# Patient Record
Sex: Female | Born: 1975 | Race: Black or African American | Hispanic: No | Marital: Married | Smoking: Never smoker
Health system: Southern US, Community
[De-identification: ages and names within clinical notes are randomized; demographics above are authoritative.]

## PROBLEM LIST (undated history)

## (undated) DIAGNOSIS — A6920 Lyme disease, unspecified: Secondary | ICD-10-CM

## (undated) DIAGNOSIS — M797 Fibromyalgia: Secondary | ICD-10-CM

## (undated) DIAGNOSIS — J45909 Unspecified asthma, uncomplicated: Secondary | ICD-10-CM

## (undated) DIAGNOSIS — F909 Attention-deficit hyperactivity disorder, unspecified type: Secondary | ICD-10-CM

## (undated) DIAGNOSIS — M1990 Unspecified osteoarthritis, unspecified site: Secondary | ICD-10-CM

## (undated) HISTORY — DX: Unspecified asthma, uncomplicated: J45.909

## (undated) HISTORY — DX: Attention-deficit hyperactivity disorder, unspecified type: F90.9

## (undated) HISTORY — PX: ABDOMINAL HYSTERECTOMY: SHX81

## (undated) HISTORY — DX: Unspecified osteoarthritis, unspecified site: M19.90

## (undated) HISTORY — PX: CHOLECYSTECTOMY: SHX55

## (undated) HISTORY — PX: BACK SURGERY: SHX140

---

## 1999-04-28 ENCOUNTER — Emergency Department (HOSPITAL_COMMUNITY): Admission: EM | Admit: 1999-04-28 | Discharge: 1999-04-28 | Payer: Self-pay | Admitting: Emergency Medicine

## 1999-04-28 ENCOUNTER — Encounter: Payer: Self-pay | Admitting: Emergency Medicine

## 1999-05-03 ENCOUNTER — Encounter: Admission: RE | Admit: 1999-05-03 | Discharge: 1999-08-01 | Payer: Self-pay

## 2000-07-12 ENCOUNTER — Emergency Department (HOSPITAL_COMMUNITY): Admission: EM | Admit: 2000-07-12 | Discharge: 2000-07-13 | Payer: Self-pay | Admitting: Emergency Medicine

## 2000-10-01 ENCOUNTER — Other Ambulatory Visit: Admission: RE | Admit: 2000-10-01 | Discharge: 2000-10-01 | Payer: Self-pay | Admitting: Gynecology

## 2001-02-01 ENCOUNTER — Other Ambulatory Visit: Admission: RE | Admit: 2001-02-01 | Discharge: 2001-02-01 | Payer: Self-pay | Admitting: Gynecology

## 2001-07-11 ENCOUNTER — Encounter: Admission: RE | Admit: 2001-07-11 | Discharge: 2001-07-24 | Payer: Self-pay | Admitting: Family Medicine

## 2001-07-25 ENCOUNTER — Encounter: Admission: RE | Admit: 2001-07-25 | Discharge: 2001-08-23 | Payer: Self-pay | Admitting: Family Medicine

## 2001-10-23 ENCOUNTER — Other Ambulatory Visit: Admission: RE | Admit: 2001-10-23 | Discharge: 2001-10-23 | Payer: Self-pay | Admitting: Obstetrics and Gynecology

## 2001-11-13 ENCOUNTER — Encounter: Admission: RE | Admit: 2001-11-13 | Discharge: 2001-12-09 | Payer: Self-pay | Admitting: Family Medicine

## 2003-12-14 ENCOUNTER — Other Ambulatory Visit: Admission: RE | Admit: 2003-12-14 | Discharge: 2003-12-14 | Payer: Self-pay | Admitting: Obstetrics and Gynecology

## 2010-04-04 ENCOUNTER — Emergency Department (HOSPITAL_COMMUNITY): Admission: EM | Admit: 2010-04-04 | Discharge: 2010-04-04 | Payer: Self-pay | Admitting: Emergency Medicine

## 2011-09-23 ENCOUNTER — Emergency Department (HOSPITAL_COMMUNITY)
Admission: EM | Admit: 2011-09-23 | Discharge: 2011-09-23 | Disposition: A | Discharge status: Home / Self Care | Payer: Managed Care, Other (non HMO) | Attending: Emergency Medicine | Admitting: Emergency Medicine

## 2011-09-23 ENCOUNTER — Emergency Department (HOSPITAL_COMMUNITY): Payer: Managed Care, Other (non HMO)

## 2011-09-23 DIAGNOSIS — R5381 Other malaise: Secondary | ICD-10-CM | POA: Insufficient documentation

## 2011-09-23 DIAGNOSIS — I10 Essential (primary) hypertension: Secondary | ICD-10-CM | POA: Insufficient documentation

## 2011-09-23 DIAGNOSIS — G4700 Insomnia, unspecified: Secondary | ICD-10-CM | POA: Insufficient documentation

## 2011-09-23 DIAGNOSIS — R071 Chest pain on breathing: Secondary | ICD-10-CM | POA: Insufficient documentation

## 2011-09-23 DIAGNOSIS — R630 Anorexia: Secondary | ICD-10-CM | POA: Insufficient documentation

## 2011-09-23 DIAGNOSIS — IMO0001 Reserved for inherently not codable concepts without codable children: Secondary | ICD-10-CM | POA: Insufficient documentation

## 2011-09-23 DIAGNOSIS — Z79899 Other long term (current) drug therapy: Secondary | ICD-10-CM | POA: Insufficient documentation

## 2011-09-23 DIAGNOSIS — R0602 Shortness of breath: Secondary | ICD-10-CM | POA: Insufficient documentation

## 2011-09-23 DIAGNOSIS — R110 Nausea: Secondary | ICD-10-CM | POA: Insufficient documentation

## 2011-09-23 DIAGNOSIS — R42 Dizziness and giddiness: Secondary | ICD-10-CM | POA: Insufficient documentation

## 2011-09-23 LAB — BASIC METABOLIC PANEL
BUN: 9 mg/dL (ref 6–23)
CO2: 28 mEq/L (ref 19–32)
Calcium: 9.6 mg/dL (ref 8.4–10.5)
Chloride: 100 mEq/L (ref 96–112)
Creatinine, Ser: 0.78 mg/dL (ref 0.50–1.10)
GFR calc Af Amer: >60 mL/min (ref >60)
GFR calc non Af Amer: >60 mL/min (ref >60)
Glucose, Bld: 120 mg/dL — ABNORMAL HIGH (ref 70–99)
Potassium: 3.4 mEq/L — ABNORMAL LOW (ref 3.5–5.1)
Sodium: 137 mEq/L (ref 135–145)

## 2011-09-23 LAB — CBC
HCT: 35.6 % — ABNORMAL LOW (ref 36.0–46.0)
Hemoglobin: 12.1 g/dL (ref 12.0–15.0)
MCH: 26.5 pg (ref 26.0–34.0)
MCHC: 34 g/dL (ref 30.0–36.0)
MCV: 78.1 fL (ref 78.0–100.0)
Platelets: 285 10*3/uL (ref 150–400)
RBC: 4.56 MIL/uL (ref 3.87–5.11)
RDW: 13.2 % (ref 11.5–15.5)
WBC: 9.6 10*3/uL (ref 4.0–10.5)

## 2011-09-23 LAB — CK TOTAL AND CKMB (NOT AT ARMC)
CK, MB: 1.7 ng/mL (ref 0.3–4.0)
Relative Index: 1.2 (ref 0.0–2.5)
Total CK: 138 U/L (ref 7–177)

## 2011-09-23 LAB — POCT I-STAT TROPONIN I: Troponin i, poc: 0 ng/mL (ref 0.00–0.08)

## 2011-09-23 LAB — D-DIMER, QUANTITATIVE (NOT AT ARMC): D-Dimer, Quant: 0.87 ug/mL-FEU — ABNORMAL HIGH (ref 0.00–0.48)

## 2011-09-23 MED ORDER — IOHEXOL 300 MG/ML  SOLN: 100.0000 mL | Freq: Once | INTRAMUSCULAR | Status: DC | PRN

## 2012-07-08 ENCOUNTER — Encounter (HOSPITAL_COMMUNITY): Payer: Self-pay | Admitting: Emergency Medicine

## 2012-07-08 ENCOUNTER — Emergency Department (HOSPITAL_COMMUNITY)
Admission: EM | Admit: 2012-07-08 | Discharge: 2012-07-08 | Disposition: A | Discharge status: Home / Self Care | Payer: Managed Care, Other (non HMO) | Attending: Emergency Medicine | Admitting: Emergency Medicine

## 2012-07-08 DIAGNOSIS — IMO0001 Reserved for inherently not codable concepts without codable children: Secondary | ICD-10-CM | POA: Insufficient documentation

## 2012-07-08 DIAGNOSIS — M545 Low back pain, unspecified: Secondary | ICD-10-CM | POA: Insufficient documentation

## 2012-07-08 HISTORY — DX: Fibromyalgia: M79.7

## 2012-07-08 MED ORDER — TRAMADOL HCL 50 MG PO TABS
50.0000 mg | ORAL_TABLET | Freq: Once | ORAL | Status: AC
  Administered 2012-07-08: 50 mg via ORAL
  Filled 2012-07-08: qty 1

## 2012-07-08 MED ORDER — KETOROLAC TROMETHAMINE 60 MG/2ML IM SOLN
60.0000 mg | Freq: Once | INTRAMUSCULAR | Status: AC
  Administered 2012-07-08: 60 mg via INTRAMUSCULAR
  Filled 2012-07-08: qty 2

## 2012-07-08 MED ORDER — KETOROLAC TROMETHAMINE 10 MG PO TABS: 10.0000 mg | ORAL_TABLET | Freq: Four times a day (QID) | ORAL | Status: AC | PRN

## 2012-07-08 MED ORDER — TRAMADOL HCL 50 MG PO TABS: 50.0000 mg | ORAL_TABLET | Freq: Four times a day (QID) | ORAL | Status: AC | PRN

## 2012-07-08 NOTE — ED Notes (Addendum)
Pt reports back pain that radiates to right leg down the back and to her foot at 10/10. Denies fall or injury. Denied bladder or bowel problems.

## 2012-07-08 NOTE — ED Provider Notes (Signed)
Medical screening examination/treatment/procedure(s) were performed by non-physician practitioner and as supervising physician I was immediately available for consultation/collaboration.   Lyanne Co, MD 07/08/12 1013

## 2012-07-08 NOTE — ED Provider Notes (Signed)
History     CSN: [id number]  Arrival date & time 07/08/12  0907   First MD Initiated Contact with Patient 07/08/12 0932      No chief complaint on file.   (Consider location/radiation/quality/duration/timing/severity/associated sxs/prior treatment) Patient is a 36 y.o. female presenting with back pain. The history is provided by the patient and a parent.  Back Pain  The pain is at a severity of 10/10. Pertinent negatives include no fever, no numbness, no dysuria and no weakness.   36 y/o female INAD c/o bilateral low back pain x1 day. Denies fever/chills, numbness, change in bowel or bladder habits, or trauma.  Pain is positional and not relived by muscle relaxers or OTC Rx.   Past Medical History  Diagnosis Date  . Fibromyalgia     History reviewed. No pertinent past surgical history.  No family history on file.  History  Substance Use Topics  . Smoking status: Never Smoker   . Smokeless tobacco: Not on file  . Alcohol Use: No    OB History    Grav Para Term Preterm Abortions TAB SAB Ect Mult Living                  Review of Systems  Constitutional: Negative for fever, fatigue and unexpected weight change.  Gastrointestinal: Negative for nausea, vomiting, diarrhea, constipation and rectal pain.  Genitourinary: Negative for dysuria, hematuria, flank pain and menstrual problem.  Musculoskeletal: Positive for back pain.  Skin: Negative for rash.  Neurological: Negative for weakness and numbness.  All other systems reviewed and are negative.    Allergies  Review of patient's allergies indicates no known allergies.  Home Medications   Current Outpatient Rx  Name Route Sig Dispense Refill  . CYCLOBENZAPRINE HCL 10 MG PO TABS Oral Take 10 mg by mouth 3 (three) times daily as needed. pain    . NAPROXEN 500 MG PO TABS Oral Take 1,000 mg by mouth 2 (two) times daily as needed. pain      BP 150/88  Pulse 86  Temp 98 F (36.7 C) (Oral)  Resp 18  SpO2  100%  Physical Exam  Nursing note and vitals reviewed. Constitutional: She is oriented to person, place, and time. She appears well-developed and well-nourished. No distress.  HENT:  Head: Normocephalic.  Eyes: Conjunctivae and EOM are normal.  Cardiovascular: Normal rate.   Pulmonary/Chest: Effort normal.  Musculoskeletal: Normal range of motion.       Bilateral tenderness to paraspinal muscles in the lumbar region  Neurological: She is alert and oriented to person, place, and time.       CN 3-12 intact with strength and sensation normal x4 extremities  Psychiatric: She has a normal mood and affect.    ED Course  Procedures (including critical care time)  Labs Reviewed - No data to display No results found.   1. Lumbago       MDM  Healthy female with acute exacerbation of of chronic back pain. No red flags. Will d/c with toradol and tramadol.  Pt verbalized understanding and agrees with care plan. Outpatient follow-up and return precautions given.           Wynetta Emery, PA-C 07/08/12 1008

## 2020-05-10 ENCOUNTER — Emergency Department (HOSPITAL_BASED_OUTPATIENT_CLINIC_OR_DEPARTMENT_OTHER): Payer: Federal, State, Local not specified - PPO

## 2020-05-10 ENCOUNTER — Other Ambulatory Visit: Payer: Self-pay

## 2020-05-10 ENCOUNTER — Encounter (HOSPITAL_BASED_OUTPATIENT_CLINIC_OR_DEPARTMENT_OTHER): Payer: Self-pay | Admitting: *Deleted

## 2020-05-10 ENCOUNTER — Emergency Department (HOSPITAL_BASED_OUTPATIENT_CLINIC_OR_DEPARTMENT_OTHER)
Admission: EM | Admit: 2020-05-10 | Discharge: 2020-05-10 | Disposition: A | Discharge status: Home / Self Care | Payer: Federal, State, Local not specified - PPO | Attending: Emergency Medicine | Admitting: Emergency Medicine

## 2020-05-10 DIAGNOSIS — Z79899 Other long term (current) drug therapy: Secondary | ICD-10-CM | POA: Insufficient documentation

## 2020-05-10 DIAGNOSIS — R0602 Shortness of breath: Secondary | ICD-10-CM | POA: Diagnosis present

## 2020-05-10 DIAGNOSIS — Z20822 Contact with and (suspected) exposure to covid-19: Secondary | ICD-10-CM | POA: Insufficient documentation

## 2020-05-10 DIAGNOSIS — R05 Cough: Secondary | ICD-10-CM | POA: Diagnosis not present

## 2020-05-10 DIAGNOSIS — J4541 Moderate persistent asthma with (acute) exacerbation: Secondary | ICD-10-CM | POA: Insufficient documentation

## 2020-05-10 DIAGNOSIS — J069 Acute upper respiratory infection, unspecified: Secondary | ICD-10-CM | POA: Diagnosis not present

## 2020-05-10 HISTORY — DX: Lyme disease, unspecified: A69.20

## 2020-05-10 LAB — CBC WITH DIFFERENTIAL/PLATELET
Abs Immature Granulocytes: 0.03 10*3/uL (ref 0.00–0.07)
Basophils Absolute: 0 10*3/uL (ref 0.0–0.1)
Basophils Relative: 0 %
Eosinophils Absolute: 0.1 10*3/uL (ref 0.0–0.5)
Eosinophils Relative: 1 %
HCT: 38.4 % (ref 36.0–46.0)
Hemoglobin: 12.4 g/dL (ref 12.0–15.0)
Immature Granulocytes: 0 %
Lymphocytes Relative: 26 %
Lymphs Abs: 2.6 10*3/uL (ref 0.7–4.0)
MCH: 26.1 pg (ref 26.0–34.0)
MCHC: 32.3 g/dL (ref 30.0–36.0)
MCV: 80.8 fL (ref 80.0–100.0)
Monocytes Absolute: 1 10*3/uL (ref 0.1–1.0)
Monocytes Relative: 10 %
Neutro Abs: 6 10*3/uL (ref 1.7–7.7)
Neutrophils Relative %: 63 %
Platelets: 293 10*3/uL (ref 150–400)
RBC: 4.75 MIL/uL (ref 3.87–5.11)
RDW: 13.4 % (ref 11.5–15.5)
WBC: 9.8 10*3/uL (ref 4.0–10.5)
nRBC: 0 % (ref 0.0–0.2)

## 2020-05-10 LAB — BASIC METABOLIC PANEL
Anion gap: 11 (ref 5–15)
BUN: 16 mg/dL (ref 6–20)
CO2: 25 mmol/L (ref 22–32)
Calcium: 9.3 mg/dL (ref 8.9–10.3)
Chloride: 100 mmol/L (ref 98–111)
Creatinine, Ser: 0.94 mg/dL (ref 0.44–1.00)
GFR calc Af Amer: >60 mL/min (ref >60)
GFR calc non Af Amer: >60 mL/min (ref >60)
Glucose, Bld: 84 mg/dL (ref 70–99)
Potassium: 3.4 mmol/L — ABNORMAL LOW (ref 3.5–5.1)
Sodium: 136 mmol/L (ref 135–145)

## 2020-05-10 LAB — PREGNANCY, URINE: Preg Test, Ur: NEGATIVE

## 2020-05-10 LAB — SARS CORONAVIRUS 2 BY RT PCR (HOSPITAL ORDER, PERFORMED IN ~~LOC~~ HOSPITAL LAB): SARS Coronavirus 2: NEGATIVE

## 2020-05-10 MED ORDER — IPRATROPIUM-ALBUTEROL 0.5-2.5 (3) MG/3ML IN SOLN
3.0000 mL | Freq: Once | RESPIRATORY_TRACT | Status: AC
  Administered 2020-05-10: 3 mL via RESPIRATORY_TRACT
  Filled 2020-05-10: qty 3

## 2020-05-10 MED ORDER — PREDNISONE 50 MG PO TABS
60.0000 mg | ORAL_TABLET | Freq: Once | ORAL | Status: AC
  Administered 2020-05-10: 60 mg via ORAL
  Filled 2020-05-10: qty 1

## 2020-05-10 MED ORDER — IOHEXOL 350 MG/ML SOLN
100.0000 mL | Freq: Once | INTRAVENOUS | Status: AC
  Administered 2020-05-10: 100 mL via INTRAVENOUS

## 2020-05-10 MED ORDER — FLOVENT HFA 44 MCG/ACT IN AERO: 2.0000 | INHALATION_SPRAY | Freq: Two times a day (BID) | RESPIRATORY_TRACT | 12 refills | Status: AC

## 2020-05-10 MED ORDER — PREDNISONE 10 MG PO TABS: 50.0000 mg | ORAL_TABLET | Freq: Every day | ORAL | 0 refills | Status: AC

## 2020-05-10 MED ORDER — BENZONATATE 200 MG PO CAPS: 200.0000 mg | ORAL_CAPSULE | Freq: Three times a day (TID) | ORAL | 0 refills | Status: AC

## 2020-05-10 NOTE — ED Provider Notes (Signed)
[facility]PART[facility] Provider Note   CSN: [id number] Arrival date & time: 05/10/20  1912     History Chief Complaint  Patient presents with  . Shortness of Breath    Melissa Donovan is a 44 y.o. female.  44 year old female with past medical history of chronic Lyme disease and antiphospholipid antibody syndrome, asthma, fibromyalgia, chest wall pain presents with complaint of shortness of breath, worse with exertion as well as pressure in her chest.  Patient states that her symptoms started as congestion and runny nose 1 week ago, progressing to cough with wheezing and shortness of breath.  Patient went to urgent care 2 days ago and had a negative Covid test.  Patient self treated at home with her albuterol inhaler as well as clindamycin and minocycline which she has on hand for her Lyme disease.  Patient has not taken prednisone, states that she has a history of bronchitis, concerned this is what she has at this point.  Denies fevers or chills, no known sick contacts.  No other complaints or concerns.  Denies history of PE or DVT however due to her antiphospholipid antibody syndrome, was seen by hematology who recommended Eliquis however patient could not take this with her birth control and opted not to take the Eliquis.        Past Medical History:  Diagnosis Date  . Fibromyalgia   . Lyme disease     There are no problems to display for this patient.   History reviewed. No pertinent surgical history.   OB History   No obstetric history on file.     History reviewed. No pertinent family history.  Social History   Tobacco Use  . Smoking status: Never Smoker  . Smokeless tobacco: Never Used  Substance Use Topics  . Alcohol use: No  . Drug use: Never    Home Medications Prior to Admission medications   Medication Sig Start Date End Date Taking? Authorizing Provider  benzonatate (TESSALON) 200 MG capsule Take 1 capsule (200 mg total) by  mouth every 8 (eight) hours for 10 days. 05/10/20 05/20/20  Tacy Learn, PA-C  cyclobenzaprine (FLEXERIL) 10 MG tablet Take 10 mg by mouth 3 (three) times daily as needed. pain    [provider]  fluticasone (FLOVENT HFA) 44 MCG/ACT inhaler Inhale 2 puffs into the lungs 2 (two) times daily. 05/10/20   Tacy Learn, PA-C  naproxen (NAPROSYN) 500 MG tablet Take 1,000 mg by mouth 2 (two) times daily as needed. pain    [provider]  predniSONE (DELTASONE) 10 MG tablet Take 5 tablets (50 mg total) by mouth daily for 4 days. 05/10/20 05/14/20  Tacy Learn, PA-C    Allergies    Patient has no known allergies.  Review of Systems   Review of Systems  Constitutional: Negative for chills, diaphoresis and fever.  HENT: Positive for congestion and rhinorrhea.   Respiratory: Positive for cough, chest tightness, shortness of breath and wheezing.   Cardiovascular: Positive for chest pain.  Gastrointestinal: Negative for vomiting.  Musculoskeletal: Negative for arthralgias and myalgias.  Skin: Negative for rash and wound.  Neurological: Negative for weakness.  Hematological: Negative for adenopathy.  All other systems reviewed and are negative.   Physical Exam Updated Vital Signs BP (!) 144/92 (BP Location: Right Arm)   Pulse (!) 115   Temp 98.5 F (36.9 C) (Oral)   Resp (!) 26   Ht 5\' 8"  (1.727 m)   Wt 97.1 kg  LMP 04/20/2020   SpO2 100%   BMI 32.54 kg/m   Physical Exam Vitals and nursing note reviewed.  Constitutional:      General: She is not in acute distress.    Appearance: She is well-developed. She is not diaphoretic.  HENT:     Head: Normocephalic and atraumatic.  Cardiovascular:     Rate and Rhythm: Regular rhythm. Tachycardia present.  Pulmonary:     Effort: Pulmonary effort is normal.     Breath sounds: No decreased breath sounds or wheezing.  Chest:     Chest wall: Tenderness present.  Musculoskeletal:     Cervical back: Neck supple.      Right lower leg: No edema.     Left lower leg: No edema.  Skin:    General: Skin is warm and dry.  Neurological:     Mental Status: She is alert and oriented to person, place, and time.  Psychiatric:        Behavior: Behavior normal.     ED Results / Procedures / Treatments   Labs (all labs ordered are listed, but only abnormal results are displayed) Labs Reviewed  BASIC METABOLIC PANEL - Abnormal; Notable for the following components:      Result Value   Potassium 3.4 (*)    All other components within normal limits  SARS CORONAVIRUS 2 BY RT PCR (HOSPITAL ORDER, [facility])  CBC WITH DIFFERENTIAL/PLATELET  PREGNANCY, URINE    EKG EKG Interpretation  Date/Time:  Monday May 10 2020 20:02:40 EDT Ventricular Rate:  108 PR Interval:    QRS Duration: 77 QT Interval:  333 QTC Calculation: 447 R Axis:   44 Text Interpretation: Sinus tachycardia No significant change since last tracing Confirmed by Isla Pence [phone]) on 05/10/2020 8:09:24 PM   Radiology CT Angio Chest PE W/Cm &/Or Wo Cm  Result Date: 05/10/2020 CLINICAL DATA:  Shortness of breath, cough EXAM: CT ANGIOGRAPHY CHEST WITH CONTRAST TECHNIQUE: Multidetector CT imaging of the chest was performed using the standard protocol during bolus administration of intravenous contrast. Multiplanar CT image reconstructions and MIPs were obtained to evaluate the vascular anatomy. CONTRAST:  133mL OMNIPAQUE IOHEXOL 350 MG/ML SOLN COMPARISON:  09/23/2011 FINDINGS: Cardiovascular: No filling defects in the pulmonary arteries to suggest pulmonary emboli. Heart is normal size. Aorta normal caliber. No dissection. Mediastinum/Nodes: No mediastinal, hilar, or axillary adenopathy. Trachea and esophagus are unremarkable. Thyroid unremarkable. Lungs/Pleura: Lungs are clear. No focal airspace opacities or suspicious nodules. No effusions. Upper Abdomen: Imaging into the upper abdomen shows no acute findings.  Musculoskeletal: Chest wall soft tissues are unremarkable. No acute bony abnormality. Review of the MIP images confirms the above findings. IMPRESSION: No evidence of pulmonary embolus. No acute cardiopulmonary disease. Electronically Signed   By: Rolm Baptise M.D.   On: 05/10/2020 21:22   DG Chest Port 1 View  Result Date: 05/10/2020 CLINICAL DATA:  Cough and shortness of breath EXAM: PORTABLE CHEST 1 VIEW COMPARISON:  09/23/2011 FINDINGS: The heart size and mediastinal contours are within normal limits. Both lungs are clear. The visualized skeletal structures are unremarkable. IMPRESSION: No active disease. Electronically Signed   By: Constance Holster M.D.   On: 05/10/2020 20:31    Procedures Procedures (including critical care time)  Medications Ordered in ED Medications  iohexol (OMNIPAQUE) 350 MG/ML injection 100 mL (100 mLs Intravenous Contrast Given 05/10/20 2104)  predniSONE (DELTASONE) tablet 60 mg (60 mg Oral Given 05/10/20 2146)  ipratropium-albuterol (DUONEB) 0.5-2.5 (3) MG/3ML nebulizer  solution 3 mL (3 mLs Nebulization Given 05/10/20 2148)    ED Course  I have reviewed the triage vital signs and the nursing notes.  Pertinent labs & imaging results that were available during my care of the patient were reviewed by me and considered in my medical decision making (see chart for details).  Clinical Course as of May 10 2245  Mon May 10, 2020  2241 44yo female with history listed above, arrives to the ER tachycardic with HR of 125, EKG shows sinus tach. Due to medical history with [facility] and tachycardia, proceeded with CTA chest to evaluate for PE, CT is normal. Labs without significant findings including negative COVID, CBC, BMP WNL, upreg negative.  Patient was given prednisone as well as duoneb treatment with some improvement.  Patient takes singulair daily for her asthma, has an albuterol inhaler, also uses Nasocort. Patient has an inhaler used for COPD given to her by her PCP to  treat an acute bronchitis episode last year, advised not to use this. Will add Flovent to her daily regimen with prednisone burst. Also given Tessalon for cough. Recommend recheck with PCP, return to ER as needed.   [LM]    Clinical Course User Index [LM] Roque Lias   MDM Rules/Calculators/A&P                      Final Clinical Impression(s) / ED Diagnoses Final diagnoses:  Viral URI with cough  Moderate persistent asthma with exacerbation    Rx / DC Orders ED Discharge Orders         Ordered    benzonatate (TESSALON) 200 MG capsule  Every 8 hours     05/10/20 2234    predniSONE (DELTASONE) 10 MG tablet  Daily     05/10/20 2234    fluticasone (FLOVENT HFA) 44 MCG/ACT inhaler  2 times daily     05/10/20 2234           Tacy Learn, PA-C 05/10/20 2246    Isla Pence, MD 05/10/20 2319

## 2020-05-10 NOTE — ED Notes (Signed)
Pt. Had a COVID Test on Sat at [facility] on  Sat and was Neg.  Pt. Reports she does get Bronchitis and feels this may be same.  Pt. Has Chronic Lyme disease and suffers with this.  Pt. Takes constant abx therapy for this.  Pt. Reports she feels she needs poss. Chest x ray and maybe prednisone due to her chest possb. Bronchitis and shortness of breath for 4 days and feeling sick since last Tues.  Pt. Reports to RN  This is why she went for COVID test on Sat.    Pt. Has had no fever.Marland Kitchen

## 2020-05-10 NOTE — Discharge Instructions (Addendum)
Take Flovent twice daily as prescribed.  Use your rescue inhaler as needed every 4-6 hours. Take prednisone daily as prescribed. Take Tessalon as needed as prescribed for cough. Recheck with your primary care provider.

## 2020-05-10 NOTE — ED Triage Notes (Signed)
SOB. Cough. Hx of asthma. She had a negative Covid test 2 days ago.  Lyme disease.

## 2021-01-14 NOTE — Progress Notes (Signed)
[facility] Note  01/19/2021     CHIEF COMPLAINT Patient presents for Retina Evaluation   HISTORY OF PRESENT ILLNESS: Melissa Donovan is a 45 y.o. female who presents to the clinic today for:   HPI    Retina Evaluation    In both eyes.  This started 2 weeks ago.  Associated Symptoms Pain.  I, the attending physician,  performed the HPI with the patient and updated documentation appropriately.          Comments    Retina eval per Dr. Posey Pronto for CME OU.  She was Dx Lyme Disease 2019 (she sees a Dr. Duncan Dull in [place] for this).  She has been having HA's and eye pain about 2 weeks OU.  She talked to Dr. Duncan Dull about her eye issues.  She started Acetazolamide 500mg  qd x7 days ago to see if it will help.  She has had inflammation though out her body.  She had this inflammation in her hand (seen ortho Dr. Rolena Infante).  Dr. Duncan Dull put her on Doxycycline to start today after her appt today.   Patient has all of her medical records with her today.  I copied last appt with Dr. Duncan Dull and her medical hx sheet.       Last edited by Bernarda Caffey, MD on 01/19/2021  9:42 AM. (History)    pt is here on the referral of Dr. Posey Pronto for concern of CME OU, pt moved here from [place], but has a complex medical history and still sees her drs in [place], pt has Lyme disease which was dx in 2019, pt has been seeing Dr. Duncan Dull in [place] since then, pt was on IV antibiotics for 8 months after dx, pt has no hx of any eye problems, she has never worn glasses, pt has been taking acetazolamide for the past 6 days and yesterday she was put back on doxycyline yesterday, but has not picked up the rx yet, pt states her exam with Dr. Posey Pronto yesterday was just a routine eye exam, pt states she has had on and off blurriness since being dx with Lyme disease, she states right now she feels like her right eye is blurry, pt endorses being light sensitive  Referring physician: Virgina Evener,  [address] [place],  [place] [address]  HISTORICAL INFORMATION:   Selected notes from the MEDICAL RECORD NUMBER Referred by Dr. Virgina Evener for concern of CME OU   CURRENT MEDICATIONS: No current outpatient medications on file. (Ophthalmic Drugs)   No current facility-administered medications for this visit. (Ophthalmic Drugs)   Current Outpatient Medications (Other)  Medication Sig  . cyclobenzaprine (FLEXERIL) 10 MG tablet Take 10 mg by mouth 3 (three) times daily as needed. pain  . fluticasone (FLOVENT HFA) 44 MCG/ACT inhaler Inhale 2 puffs into the lungs 2 (two) times daily.  . naproxen (NAPROSYN) 500 MG tablet Take 1,000 mg by mouth 2 (two) times daily as needed. pain   No current facility-administered medications for this visit. (Other)   REVIEW OF SYSTEMS: ROS    Positive for: Eyes   Negative for: Constitutional, Gastrointestinal, Neurological, Skin, Genitourinary, Musculoskeletal, HENT, Endocrine, Cardiovascular, Respiratory, Psychiatric, Allergic/Imm, Heme/Lymph   Last edited by Leonie Douglas, COA on 01/19/2021  9:33 AM. (History)     ALLERGIES No Known Allergies  PAST MEDICAL HISTORY Past Medical History:  Diagnosis Date  . Fibromyalgia   . Lyme disease    History reviewed. No pertinent surgical history.  FAMILY HISTORY History reviewed. No pertinent family history.  SOCIAL HISTORY Social History   Tobacco Use  . Smoking status: Never Smoker  . Smokeless tobacco: Never Used  Substance Use Topics  . Alcohol use: No  . Drug use: Never         OPHTHALMIC EXAM:  Base Eye Exam    Visual Acuity (Snellen - Linear)      Right Left   Dist [place] 20/70 -2 20/20   Dist ph  20/50 -2        Tonometry (Tonopen, 9:41 AM)      Right Left   Pressure 16 18       Pupils      Dark Light Shape React APD   Right 5 4 Round Brisk None   Left 5 4 Round Brisk None       Visual Fields (Counting fingers)      Left Right    Full Full        Extraocular Movement      Right Left    Full Full       Neuro/Psych    Oriented x3: Yes   Mood/Affect: Normal       Dilation    Both eyes: 1.0% Mydriacyl, 2.5% Phenylephrine @ 9:41 AM        Slit Lamp and Fundus Exam    Slit Lamp Exam      Right Left   Lids/Lashes Normal Normal   Conjunctiva/Sclera mild melanosis mild melanosis   Cornea Trace tear film debris Trace tear film debris, 1+ Punctate epithelial erosions   Anterior Chamber deep, clear, narrow temporal angle, no AC cell or flare deep, clear, narrow temporal angle, no AC cell or flare   Iris Round and dilated Round and dilated, +PPM   Lens 1+ Nuclear sclerosis 1+ Nuclear sclerosis   Vitreous Vitreous syneresis, mild cell, no snowballs Vitreous syneresis, ?trace cell, no snowballs       Fundus Exam      Right Left   Disc Pink and Sharp, +cupping, thin inferior rim Pink and Sharp   C/D Ratio 0.75 0.7   Macula Flat, Blunted foveal reflex, mild RPE mottling, trace perifoveal cystic changes, no heme Flat, Blunted foveal reflex, RPE mottling, trace cystic changes IN to fovea, no heme   Vessels attenuated, mild Copper wiring attenuated, mild Copper wiring   Periphery Attached, White without pressure, scattered punctate focal pigment changes, no snowbanking Attached, no snowbanking           Refraction    Manifest Refraction      Sphere Dist VA   Right [place] NI   Left            IMAGING AND PROCEDURES  Imaging and Procedures for 01/19/2021  OCT, Retina - OU - Both Eyes       Right Eye Quality was good. Central Foveal Thickness: 243. Progression has no prior data. Findings include normal foveal contour, intraretinal fluid, no SRF, vitreomacular adhesion  (Perifoveal cystic changes v schisis).   Left Eye Quality was good. Central Foveal Thickness: 249. Progression has no prior data. Findings include normal foveal contour, intraretinal fluid, no SRF (Focal IRF v schisis IN macula).   Notes *Images captured and  stored on drive  Diagnosis / Impression:  OD: NFP; Perifoveal cystic changes v schisis OS: NFP; Focal IRF v schisis IN macula  Clinical management:  See below  Abbreviations: NFP - Normal foveal profile. CME - cystoid macular edema. PED -  pigment epithelial detachment. IRF - intraretinal fluid. SRF - subretinal fluid. EZ - ellipsoid zone. ERM - epiretinal membrane. ORA - outer retinal atrophy. ORT - outer retinal tubulation. SRHM - subretinal hyper-reflective material. IRHM - intraretinal hyper-reflective material .       Fluorescein Angiography Optos (Transit OD)       Right Eye   Progression has no prior data. Early phase findings include window defect. Mid/Late phase findings include window defect (No leakage or vasculitis; no CME).   Left Eye   Progression has no prior data. Early phase findings include normal observations. Mid/Late phase findings include normal observations (No leakage or vasculitis; no CME).   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 Retinal edema  H35.81 OCT, Retina - OU - Both Eyes  3. Lyme disease  A69.20   4. Essential hypertension  I10   5. Hypertensive retinopathy of both eyes  H35.033 Fluorescein Angiography Optos (Transit OD)  6. Nuclear sclerosis of both eyes  H25.13   7. Glaucoma suspect of both eyes  H40.003    1,2. 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 Serita Grit office -- ?acetazolamide improving cystic edema  - discussed findings  - recommend monitoring for now  - f/u 2-3  wks  3. History of chronic Lyme Disease  - under the expert management of Dr. Duncan Dull  - no inflammation noted on dilated exam -- no anterior or posterior uveitis  - monitor  4,5. Hypertensive retinopathy OU - discussed importance of tight BP control - monitor  6. Nuclear Sclerosis OU - The symptoms of cataract, surgical options, and treatments and risks were discussed with patient - discussed diagnosis and progression - not yet visually significant - monitor for now  7. Glaucoma Suspect OU  - +cupping (OD > OS)  - +fam hx  - IOP 16,18  Ophthalmic Meds Ordered this visit:  No orders of the defined types were placed in this encounter.     Return for f/u 2-3 weeks, CME OU, DFE, OCT.  There are no Patient Instructions on file for this visit.  Explained the diagnoses, plan, and follow up with the patient and they expressed understanding.  Patient expressed understanding of the importance of proper follow up care.   This document serves as a record of services personally performed by Gardiner Sleeper, MD, PhD. It was created on their behalf by Roselee Nova, COMT. The creation of this record is the provider's dictation and/or activities during the visit.  Electronically signed by: Roselee Nova, COMT 01/19/21 1:36 PM  This document serves as a record of services personally performed by Gardiner Sleeper, MD, PhD. It was created on their behalf by Estill Bakes, COT an ophthalmic technician. The creation of this record is the provider's dictation and/or activities during the visit.    Electronically signed by: Estill Bakes, COT 1.26.22 @ 1:36 PM  Gardiner Sleeper, M.D., Ph.D. Diseases & Surgery of the Retina and  Vitreous [facility] 1.26.22  I have reviewed the above documentation for accuracy and completeness, and I agree with the above. Gardiner Sleeper, M.D., Ph.D. 01/19/21 1:36 PM  Abbreviations: M myopia (nearsighted); A astigmatism; H hyperopia  (farsighted); P presbyopia; Mrx spectacle prescription;  CTL contact lenses; OD right eye; OS left eye; OU both eyes  XT exotropia; ET esotropia; PEK punctate epithelial keratitis; PEE punctate epithelial erosions; DES dry eye syndrome; MGD meibomian gland dysfunction; ATs artificial tears; PFAT's preservative free artificial tears; [place] nuclear sclerotic cataract; PSC posterior subcapsular cataract; ERM epi-retinal membrane; PVD posterior vitreous detachment; RD retinal detachment; DM diabetes mellitus; DR diabetic retinopathy; NPDR non-proliferative diabetic retinopathy; PDR proliferative diabetic retinopathy; CSME clinically significant macular edema; DME diabetic macular edema; dbh dot blot hemorrhages; CWS cotton wool spot; POAG primary open angle glaucoma; C/D cup-to-disc ratio; HVF humphrey visual field; GVF goldmann visual field; OCT optical coherence tomography; IOP intraocular pressure; BRVO Branch retinal vein occlusion; CRVO central retinal vein occlusion; CRAO central retinal artery occlusion; BRAO branch retinal artery occlusion; RT retinal tear; SB scleral buckle; PPV pars plana vitrectomy; VH Vitreous hemorrhage; PRP panretinal laser photocoagulation; IVK intravitreal kenalog; VMT vitreomacular traction; MH Macular hole;  NVD neovascularization of the disc; NVE neovascularization elsewhere; AREDS age related eye disease study; ARMD age related macular degeneration; POAG primary open angle glaucoma; EBMD epithelial/anterior basement membrane dystrophy; ACIOL anterior chamber intraocular lens; IOL intraocular lens; PCIOL posterior chamber intraocular lens; Phaco/IOL phacoemulsification with intraocular lens placement; [place] photorefractive keratectomy; LASIK laser assisted in situ keratomileusis; HTN hypertension; DM diabetes mellitus; COPD chronic obstructive pulmonary disease

## 2021-01-19 ENCOUNTER — Other Ambulatory Visit: Payer: Self-pay

## 2021-01-19 ENCOUNTER — Ambulatory Visit (INDEPENDENT_AMBULATORY_CARE_PROVIDER_SITE_OTHER): Payer: Federal, State, Local not specified - PPO | Admitting: Ophthalmology

## 2021-01-19 ENCOUNTER — Encounter (INDEPENDENT_AMBULATORY_CARE_PROVIDER_SITE_OTHER): Payer: Self-pay | Admitting: Ophthalmology

## 2021-01-19 DIAGNOSIS — H3581 Retinal edema: Secondary | ICD-10-CM

## 2021-01-19 DIAGNOSIS — H35033 Hypertensive retinopathy, bilateral: Secondary | ICD-10-CM

## 2021-01-19 DIAGNOSIS — H33193 Other retinoschisis and retinal cysts, bilateral: Secondary | ICD-10-CM | POA: Diagnosis not present

## 2021-01-19 DIAGNOSIS — A6920 Lyme disease, unspecified: Secondary | ICD-10-CM

## 2021-01-19 DIAGNOSIS — H40003 Preglaucoma, unspecified, bilateral: Secondary | ICD-10-CM

## 2021-01-19 DIAGNOSIS — I10 Essential (primary) hypertension: Secondary | ICD-10-CM

## 2021-01-19 DIAGNOSIS — H2513 Age-related nuclear cataract, bilateral: Secondary | ICD-10-CM

## 2021-01-20 ENCOUNTER — Encounter (INDEPENDENT_AMBULATORY_CARE_PROVIDER_SITE_OTHER): Payer: Self-pay | Admitting: Ophthalmology

## 2021-01-27 ENCOUNTER — Encounter (HOSPITAL_BASED_OUTPATIENT_CLINIC_OR_DEPARTMENT_OTHER): Payer: Self-pay

## 2021-01-27 ENCOUNTER — Emergency Department (HOSPITAL_BASED_OUTPATIENT_CLINIC_OR_DEPARTMENT_OTHER)
Admission: EM | Admit: 2021-01-27 | Discharge: 2021-01-28 | Disposition: A | Discharge status: Home / Self Care | Payer: Medicare Other | Attending: Emergency Medicine | Admitting: Emergency Medicine

## 2021-01-27 ENCOUNTER — Other Ambulatory Visit: Payer: Self-pay

## 2021-01-27 ENCOUNTER — Emergency Department (HOSPITAL_BASED_OUTPATIENT_CLINIC_OR_DEPARTMENT_OTHER): Payer: Medicare Other

## 2021-01-27 DIAGNOSIS — J45909 Unspecified asthma, uncomplicated: Secondary | ICD-10-CM | POA: Diagnosis not present

## 2021-01-27 DIAGNOSIS — R079 Chest pain, unspecified: Secondary | ICD-10-CM | POA: Insufficient documentation

## 2021-01-27 DIAGNOSIS — R059 Cough, unspecified: Secondary | ICD-10-CM | POA: Diagnosis present

## 2021-01-27 DIAGNOSIS — R0602 Shortness of breath: Secondary | ICD-10-CM | POA: Diagnosis not present

## 2021-01-27 DIAGNOSIS — Z7951 Long term (current) use of inhaled steroids: Secondary | ICD-10-CM | POA: Insufficient documentation

## 2021-01-27 DIAGNOSIS — Z20822 Contact with and (suspected) exposure to covid-19: Secondary | ICD-10-CM

## 2021-01-27 MED ORDER — ALBUTEROL SULFATE HFA 108 (90 BASE) MCG/ACT IN AERS
2.0000 | INHALATION_SPRAY | RESPIRATORY_TRACT | Status: DC | PRN
  Administered 2021-01-27: 2 via RESPIRATORY_TRACT
  Filled 2021-01-27: qty 6.7

## 2021-01-27 NOTE — ED Triage Notes (Signed)
Pt presents with complaints of worsening SOB, midline CP and non prod cough x 3 days. States she has a hx of asthma and has used albuterol with minimal relief.

## 2021-01-28 ENCOUNTER — Encounter (HOSPITAL_BASED_OUTPATIENT_CLINIC_OR_DEPARTMENT_OTHER): Payer: Self-pay | Admitting: Emergency Medicine

## 2021-01-28 ENCOUNTER — Emergency Department (HOSPITAL_BASED_OUTPATIENT_CLINIC_OR_DEPARTMENT_OTHER): Payer: Medicare Other

## 2021-01-28 DIAGNOSIS — R059 Cough, unspecified: Secondary | ICD-10-CM | POA: Diagnosis not present

## 2021-01-28 LAB — CBC WITH DIFFERENTIAL/PLATELET
Abs Immature Granulocytes: 0.05 10*3/uL (ref 0.00–0.07)
Basophils Absolute: 0 10*3/uL (ref 0.0–0.1)
Basophils Relative: 0 %
Eosinophils Absolute: 0.1 10*3/uL (ref 0.0–0.5)
Eosinophils Relative: 1 %
HCT: 35.3 % — ABNORMAL LOW (ref 36.0–46.0)
Hemoglobin: 11.8 g/dL — ABNORMAL LOW (ref 12.0–15.0)
Immature Granulocytes: 1 %
Lymphocytes Relative: 27 %
Lymphs Abs: 2.8 10*3/uL (ref 0.7–4.0)
MCH: 27.7 pg (ref 26.0–34.0)
MCHC: 33.4 g/dL (ref 30.0–36.0)
MCV: 82.9 fL (ref 80.0–100.0)
Monocytes Absolute: 0.7 10*3/uL (ref 0.1–1.0)
Monocytes Relative: 7 %
Neutro Abs: 6.5 10*3/uL (ref 1.7–7.7)
Neutrophils Relative %: 64 %
Platelets: 276 10*3/uL (ref 150–400)
RBC: 4.26 MIL/uL (ref 3.87–5.11)
RDW: 13.2 % (ref 11.5–15.5)
WBC: 10.2 10*3/uL (ref 4.0–10.5)
nRBC: 0 % (ref 0.0–0.2)

## 2021-01-28 LAB — BASIC METABOLIC PANEL
Anion gap: 10 (ref 5–15)
BUN: 14 mg/dL (ref 6–20)
CO2: 18 mmol/L — ABNORMAL LOW (ref 22–32)
Calcium: 9 mg/dL (ref 8.9–10.3)
Chloride: 106 mmol/L (ref 98–111)
Creatinine, Ser: 0.79 mg/dL (ref 0.44–1.00)
GFR, Estimated: >60 mL/min (ref >60)
Glucose, Bld: 96 mg/dL (ref 70–99)
Potassium: 3.1 mmol/L — ABNORMAL LOW (ref 3.5–5.1)
Sodium: 134 mmol/L — ABNORMAL LOW (ref 135–145)

## 2021-01-28 LAB — SARS CORONAVIRUS 2 (TAT 6-24 HRS): SARS Coronavirus 2: NEGATIVE

## 2021-01-28 LAB — TROPONIN I (HIGH SENSITIVITY): Troponin I (High Sensitivity): 2 ng/L (ref <18)

## 2021-01-28 LAB — PREGNANCY, URINE: Preg Test, Ur: NEGATIVE

## 2021-01-28 MED ORDER — IOHEXOL 350 MG/ML SOLN
100.0000 mL | Freq: Once | INTRAVENOUS | Status: AC | PRN
  Administered 2021-01-28: 100 mL via INTRAVENOUS

## 2021-01-28 MED ORDER — PREDNISONE 20 MG PO TABS: ORAL_TABLET | ORAL | 0 refills | Status: DC

## 2021-01-28 NOTE — ED Provider Notes (Signed)
[facility] Provider Note   CSN: [id number] Arrival date & time: 01/27/21  1906     History Chief Complaint  Patient presents with  . Shortness of Breath    Melissa Donovan is a 45 y.o. female.  The history is provided by the patient.  Shortness of Breath Severity:  Moderate Onset quality:  Gradual Duration:  1 month Timing:  Constant Progression:  Unchanged Chronicity:  Chronic Context: URI   Context: not fumes   Relieved by:  Nothing Worsened by:  Nothing Ineffective treatments:  None tried Associated symptoms: cough   Associated symptoms: no abdominal pain, no fever, no rash, no sore throat and no sputum production   Risk factors: no recent alcohol use   Patient with chronic Lyme and Fibromyalgia with a month of cough and chest pain with cough.  She is on her second course of antibiotics currently without relief, first biaxin and now doxycycline. No travel.  No f/c/r.  No leg pain or swelling.       Past Medical History:  Diagnosis Date  . Arthritis   . Asthma   . Attention deficit hyperactivity disorder   . Fibromyalgia   . Lyme disease     There are no problems to display for this patient.   Past Surgical History:  Procedure Laterality Date  . BACK SURGERY    . CHOLECYSTECTOMY       OB History   No obstetric history on file.     History reviewed. No pertinent family history.  Social History   Tobacco Use  . Smoking status: Never Smoker  . Smokeless tobacco: Never Used  Substance Use Topics  . Alcohol use: Yes  . Drug use: Never    Home Medications Prior to Admission medications   Medication Sig Start Date End Date Taking? Authorizing Provider  acetaZOLAMIDE (DIAMOX) 500 MG capsule Take 500 mg by mouth once.    [provider]  albuterol (VENTOLIN HFA) 108 (90 Base) MCG/ACT inhaler Inhale into the lungs every 6 (six) hours as needed for wheezing or shortness of breath.    [provider]  ALPRAZolam Duanne Moron) 0.25 MG tablet Take 0.25 mg by mouth at bedtime as needed for anxiety.    [provider]  amLODipine-benazepril (LOTREL) 10-20 MG capsule Take 1 capsule by mouth daily.    [provider]  amoxicillin (AMOXIL) 125 MG/5ML suspension Take by mouth 3 (three) times daily.    [provider]  atorvastatin (LIPITOR) 10 MG tablet Take 10 mg by mouth daily.    [provider]  atovaquone (MEPRON) 750 MG/5ML suspension Take by mouth daily.    [provider]  azithromycin (ZITHROMAX) 250 MG tablet Take by mouth daily.    [provider]  benzonatate (TESSALON) 200 MG capsule Take 200 mg by mouth 3 (three) times daily as needed for cough.    [provider]  budesonide (PULMICORT) 0.5 MG/2ML nebulizer solution Take 0.5 mg by nebulization 2 (two) times daily.    [provider]  buPROPion (ZYBAN) 150 MG 12 hr tablet Take 150 mg by mouth 2 (two) times daily.    [provider]  Ciprofloxacin-Ciproflox HCl 500 MG tablet Take 500 mg by mouth daily.    [provider]  clarithromycin (BIAXIN) 500 MG tablet Take 500 mg by mouth 2 (two) times daily.    [provider]  clindamycin (CLEOCIN) 150 MG capsule Take by mouth 3 (three) times daily.  [provider]  cyclobenzaprine (FLEXERIL) 10 MG tablet Take 10 mg by mouth 3 (three) times daily as needed. pain    [provider]  dextroamphetamine (DEXTROSTAT) 10 MG tablet Take 10 mg by mouth daily.    [provider]  diazepam (VALIUM) 2 MG tablet Take 2 mg by mouth every 6 (six) hours as needed for anxiety.    [provider]  diclofenac (VOLTAREN) 75 MG EC tablet Take 75 mg by mouth 2 (two) times daily.    [provider]  disulfiram (ANTABUSE) 250 MG tablet Take 250 mg by mouth daily.    [provider]  DULoxetine (CYMBALTA) 30 MG capsule Take 30 mg by mouth daily.    [provider]  ergocalciferol (VITAMIN D2) 1.25 MG (50000 UT) capsule Take 50,000 Units by mouth once a week.    [provider]  etodolac (LODINE) 400 MG tablet Take 400 mg by mouth 2 (two) times daily.    [provider]  Fenoprofen Calcium (NALFON) 200 MG CAPS capsule Take 200 mg by mouth in the morning and at bedtime.    [provider]  fluticasone (FLOVENT HFA) 44 MCG/ACT inhaler Inhale 2 puffs into the lungs 2 (two) times daily. 05/10/20   Tacy Learn, PA-C  gabapentin (NEURONTIN) 800 MG tablet Take 800 mg by mouth 3 (three) times daily.    [provider]  Glycopyrrolate-Formoterol (BEVESPI AEROSPHERE) 9-4.8 MCG/ACT AERO Inhale into the lungs.    [provider]  ipratropium-albuterol (DUONEB) 0.5-2.5 (3) MG/3ML SOLN Take 3 mLs by nebulization.    [provider]  ketorolac (TORADOL) 10 MG tablet Take 10 mg by mouth every 6 (six) hours as needed.    [provider]  Levonorgestrel-Ethinyl Estradiol (DAYSEE) 0.15-0.03 &0.01 MG tablet Take 1 tablet by mouth daily.    [provider]  LORazepam (ATIVAN) 0.5 MG tablet Take 0.5 mg by mouth every 8 (eight) hours.    [provider]  losartan (COZAAR) 25 MG tablet Take 25 mg by mouth daily.    [provider]  meloxicam (MOBIC) 15 MG tablet Take 15 mg by mouth daily.    [provider]  memantine (NAMENDA) 10 MG tablet Take 10 mg by mouth 2 (two) times daily.    [provider]  metaxalone (SKELAXIN) 800 MG tablet Take 800 mg by mouth 3 (three) times daily.    [provider]  methocarbamol (ROBAXIN) 500 MG tablet Take 500 mg by mouth 4 (four) times daily.    [provider]  naproxen (NAPROSYN) 500 MG tablet Take 1,000 mg by mouth 2 (two) times daily as needed. pain    [provider]  norethindrone (CAMILA) 0.35 MG tablet Take 1 tablet by mouth daily.    [provider]    Allergies    Patient has no known  allergies.  Review of Systems   Review of Systems  Constitutional: Negative for fever.  HENT: Positive for congestion. Negative for sore throat.   Eyes: Negative for visual disturbance.  Respiratory: Positive for cough and shortness of breath. Negative for sputum production.   Cardiovascular: Negative for palpitations.  Gastrointestinal: Negative for abdominal pain.  Genitourinary: Negative for difficulty urinating.  Musculoskeletal: Negative for arthralgias.  Skin: Negative for rash.  Neurological: Negative for dizziness.  Psychiatric/Behavioral: Negative for agitation.  All other systems reviewed and are negative.   Physical Exam Updated Vital Signs BP (!) 141/92   Pulse 89  Temp 98.4 F (36.9 C) (Oral)   Resp 17   Ht 5\' 8"  (1.727 m)   Wt 96.2 kg   LMP 01/10/2021   SpO2 100%   BMI 32.23 kg/m   Physical Exam Vitals and nursing note reviewed.  Constitutional:      General: She is not in acute distress.    Appearance: Normal appearance.  HENT:     Head: Normocephalic and atraumatic.     Nose: Nose normal.  Eyes:     Conjunctiva/sclera: Conjunctivae normal.     Pupils: Pupils are equal, round, and reactive to light.  Cardiovascular:     Rate and Rhythm: Normal rate and regular rhythm.     Pulses: Normal pulses.     Heart sounds: Normal heart sounds.  Pulmonary:     Effort: Pulmonary effort is normal.     Breath sounds: Normal breath sounds. No wheezing or rales.  Abdominal:     General: Abdomen is flat. Bowel sounds are normal.     Palpations: Abdomen is soft.     Tenderness: There is no abdominal tenderness. There is no guarding.  Musculoskeletal:        General: Normal range of motion.     Cervical back: Normal range of motion and neck supple.  Skin:    General: Skin is warm and dry.     Capillary Refill: Capillary refill takes less than 2 seconds.  Neurological:     General: No focal deficit present.     Mental Status: She is alert and oriented to  person, place, and time.     Deep Tendon Reflexes: Reflexes normal.  Psychiatric:        Mood and Affect: Mood normal.        Behavior: Behavior normal.     ED Results / Procedures / Treatments   Labs (all labs ordered are listed, but only abnormal results are displayed) Results for orders placed or performed during the hospital encounter of 01/27/21  CBC with Differential/Platelet  Result Value Ref Range   WBC 10.2 4.0 - 10.5 K/uL   RBC 4.26 3.87 - 5.11 MIL/uL   Hemoglobin 11.8 (L) 12.0 - 15.0 g/dL   HCT 35.3 (L) 36.0 - 46.0 %   MCV 82.9 80.0 - 100.0 fL   MCH 27.7 26.0 - 34.0 pg   MCHC 33.4 30.0 - 36.0 g/dL   RDW 13.2 11.5 - 15.5 %   Platelets 276 150 - 400 K/uL   nRBC 0.0 0.0 - 0.2 %   Neutrophils Relative % 64 %   Neutro Abs 6.5 1.7 - 7.7 K/uL   Lymphocytes Relative 27 %   Lymphs Abs 2.8 0.7 - 4.0 K/uL   Monocytes Relative 7 %   Monocytes Absolute 0.7 0.1 - 1.0 K/uL   Eosinophils Relative 1 %   Eosinophils Absolute 0.1 0.0 - 0.5 K/uL   Basophils Relative 0 %   Basophils Absolute 0.0 0.0 - 0.1 K/uL   Immature Granulocytes 1 %   Abs Immature Granulocytes 0.05 0.00 - 0.07 K/uL  Basic metabolic panel  Result Value Ref Range   Sodium 134 (L) 135 - 145 mmol/L   Potassium 3.1 (L) 3.5 - 5.1 mmol/L   Chloride 106 98 - 111 mmol/L   CO2 18 (L) 22 - 32 mmol/L   Glucose, Bld 96 70 - 99 mg/dL   BUN 14 6 - 20 mg/dL   Creatinine, Ser 0.79 0.44 - 1.00 mg/dL   Calcium 9.0 8.9 -  10.3 mg/dL   GFR, Estimated >60 >60 mL/min   Anion gap 10 5 - 15  Pregnancy, urine  Result Value Ref Range   Preg Test, Ur NEGATIVE NEGATIVE  Troponin I (High Sensitivity)  Result Value Ref Range   Troponin I (High Sensitivity) 2 <18 ng/L   DG Chest 2 View  Result Date: 01/27/2021 CLINICAL DATA:  Chest pain short of breath EXAM: CHEST - 2 VIEW COMPARISON:  05/10/2020 FINDINGS: The heart size and mediastinal contours are within normal limits. Both lungs are clear. The visualized skeletal structures are  unremarkable. IMPRESSION: No active cardiopulmonary disease. Electronically Signed   By: Franchot Gallo M.D.   On: 01/27/2021 19:31   CT Angio Chest PE W and/or Wo Contrast  Result Date: 01/28/2021 CLINICAL DATA:  Worsening shortness of breath, midline chest pain and nonproductive cough for 3 days, history of asthma and Lyme disease EXAM: CT ANGIOGRAPHY CHEST WITH CONTRAST TECHNIQUE: Multidetector CT imaging of the chest was performed using the standard protocol during bolus administration of intravenous contrast. Multiplanar CT image reconstructions and MIPs were obtained to evaluate the vascular anatomy. CONTRAST:  167mL OMNIPAQUE IOHEXOL 350 MG/ML SOLN COMPARISON:  CT 05/10/2020 FINDINGS: Cardiovascular: Satisfactory opacification the pulmonary arteries to the segmental level. No pulmonary artery filling defects are identified. Central pulmonary arteries are normal caliber. Normal heart size. No pericardial effusion. The aortic root is suboptimally assessed given cardiac pulsation artifact. The aorta is normal caliber. No acute luminal abnormality of the imaged aorta. No periaortic stranding or hemorrhage. Shared origin of the brachiocephalic and left common carotid arteries. Proximal great vessels are otherwise unremarkable and free of acute abnormality. No major venous abnormalities. Mediastinum/Nodes: Some chronic fatty stippling in the anterior mediastinum may reflect thymic remnant. No mediastinal fluid or gas. Normal thyroid gland and thoracic inlet. No acute abnormality of the trachea or esophagus. No worrisome mediastinal, hilar or axillary adenopathy. Lungs/Pleura: Some mild dependent atelectatic changes are noted posteriorly. No consolidation, features of edema, pneumothorax, or effusion. No suspicious pulmonary nodules or masses. Upper Abdomen: No acute abnormalities present in the visualized portions of the upper abdomen. Musculoskeletal: No chest wall abnormality. No acute or significant osseous  findings. Degenerative changes are present in the imaged spine and shoulders. Review of the MIP images confirms the above findings. IMPRESSION: 1. No evidence of pulmonary embolism or other acute intrathoracic process. 2. Some chronic fatty stippling in the anterior mediastinum may reflect thymic remnant. Electronically Signed   By: Lovena Le M.D.   On: 01/28/2021 00:56   OCT, Retina - OU - Both Eyes  Result Date: 01/19/2021 Right Eye Quality was good. Central Foveal Thickness: 243. Progression has no prior data. Findings include normal foveal contour, intraretinal fluid, no SRF, vitreomacular adhesion  (Perifoveal cystic changes v schisis). Left Eye Quality was good. Central Foveal Thickness: 249. Progression has no prior data. Findings include normal foveal contour, intraretinal fluid, no SRF (Focal IRF v schisis IN macula). 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 CME - cystoid macular edema. PED - pigment epithelial detachment. IRF - intraretinal fluid. SRF - subretinal fluid. EZ - ellipsoid zone. ERM - epiretinal membrane. ORA - outer retinal atrophy. ORT - outer retinal tubulation. SRHM - subretinal hyper-reflective material. IRHM - intraretinal hyper-reflective material .  Fluorescein Angiography Optos (Transit OD)  Result Date: 01/19/2021 Right Eye  Progression has no prior data. Early phase findings include window defect. Mid/Late phase findings include window defect (No leakage or vasculitis; no CME). Left Eye Progression has no prior data. Early phase findings include normal observations. Mid/Late phase findings include normal observations (No leakage or vasculitis; no CME). Notes **Images stored on drive** Impression: Essentially normal OU No leakage or vasculitis OU; no CME OU    EKG EKG  Interpretation  Date/Time:  Thursday January 27 2021 19:08:20 EST Ventricular Rate:  100 PR Interval:  152 QRS Duration: 84 QT Interval:  348 QTC Calculation: 448 R Axis:   70 Text Interpretation: Normal sinus rhythm Confirmed by Nicanor Alcon, Gordan Grell ([id number]) on 01/27/2021 11:10:45 PM   Radiology DG Chest 2 View  Result Date: 01/27/2021 CLINICAL DATA:  Chest pain short of breath EXAM: CHEST - 2 VIEW COMPARISON:  05/10/2020 FINDINGS: The heart size and mediastinal contours are within normal limits. Both lungs are clear. The visualized skeletal structures are unremarkable. IMPRESSION: No active cardiopulmonary disease. Electronically Signed   By: Marlan Palau M.D.   On: 01/27/2021 19:31   CT Angio Chest PE W and/or Wo Contrast  Result Date: 01/28/2021 CLINICAL DATA:  Worsening shortness of breath, midline chest pain and nonproductive cough for 3 days, history of asthma and Lyme disease EXAM: CT ANGIOGRAPHY CHEST WITH CONTRAST TECHNIQUE: Multidetector CT imaging of the chest was performed using the standard protocol during bolus administration of intravenous contrast. Multiplanar CT image reconstructions and MIPs were obtained to evaluate the vascular anatomy. CONTRAST:  OMNIPAQUE IOHEXOL 350 MG/ML SOLN COMPARISON:  CT 05/10/2020 FINDINGS: Cardiovascular: Satisfactory opacification the pulmonary arteries to the segmental level. No pulmonary artery filling defects are identified. Central pulmonary arteries are normal caliber. Normal heart size. No pericardial effusion. The aortic root is suboptimally assessed given cardiac pulsation artifact. The aorta is normal caliber. No acute luminal abnormality of the imaged aorta. No periaortic stranding or hemorrhage. Shared origin of the brachiocephalic and left common carotid arteries. Proximal great vessels are otherwise unremarkable and free of acute abnormality. No major venous abnormalities. Mediastinum/Nodes: Some chronic fatty stippling in the anterior  mediastinum may reflect thymic remnant. No mediastinal fluid or gas. Normal thyroid gland and thoracic inlet. No acute abnormality of the trachea or esophagus. No worrisome mediastinal, hilar or axillary adenopathy. Lungs/Pleura: Some mild dependent atelectatic changes are noted posteriorly. No consolidation, features of edema, pneumothorax, or effusion. No suspicious pulmonary nodules or masses. Upper Abdomen: No acute abnormalities present in the visualized portions of the upper abdomen. Musculoskeletal: No chest wall abnormality. No acute or significant osseous findings. Degenerative changes are present in the imaged spine and shoulders. Review of the MIP images confirms the above findings. IMPRESSION: 1. No evidence of pulmonary embolism or other acute intrathoracic process. 2. Some chronic fatty stippling in the anterior mediastinum may reflect thymic remnant. Electronically Signed   By: Kreg Shropshire M.D.   On: 01/28/2021 00:56    Procedures Procedures   Medications Ordered in ED Medications  albuterol (VENTOLIN HFA) 108 (90 Base) MCG/ACT inhaler 2 puff (2 puffs Inhalation Given 01/27/21 1937)  iohexol (OMNIPAQUE) 350 MG/ML injection 100 mL (100 mLs Intravenous Contrast Given 01/28/21 0041)    ED Course  I have reviewed the triage vital signs and the nursing notes.  Pertinent labs & imaging results that were available during my care of the patient were reviewed by me and considered in my medical decision making (see chart for details).    Ruled out for MI based on  time course and with negative EKG and troponin. Rule out for PE and pneumonia with negative CTA.  This is likely covid or post covid syndrome.  I will start steroids and have patient follow up with her PMD.  Patient is well appearing and stable for discharge with close follow up.    Helia Houseknecht was evaluated in Emergency Department on 01/28/2021 for the symptoms described in the history of present illness. She was evaluated in  the context of the global COVID-19 pandemic, which necessitated consideration that the patient might be at risk for infection with the SARS-CoV-2 virus that causes COVID-19. Institutional protocols and algorithms that pertain to the evaluation of patients at risk for COVID-19 are in a state of rapid change based on information released by regulatory bodies including the CDC and federal and state organizations. These policies and algorithms were followed during the patient's care in the ED.  Final Clinical Impression(s) / ED Diagnoses Return for intractable cough, coughing up blood, fevers >100.4 unrelieved by medication, shortness of breath, intractable vomiting, chest pain, shortness of breath, weakness, numbness, changes in speech, facial asymmetry, abdominal pain, passing out, Inability to tolerate liquids or food, cough, altered mental status or any concerns. No signs of systemic illness or infection. The patient is nontoxic-appearing on exam and vital signs are within normal limits.  I have reviewed the triage vital signs and the nursing notes. Pertinent labs & imaging results that were available during my care of the patient were reviewed by me and considered in my medical decision making (see chart for details). After history, exam, and medical workup I feel the patient has been appropriately medically screened and is safe for discharge home. Pertinent diagnoses were discussed with the patient. Patient was given return precautions.    Bentzion Dauria, MD 01/28/21 [phone]

## 2021-02-01 NOTE — Progress Notes (Shared)
[facility] Note  02/02/2021     CHIEF COMPLAINT Patient presents for No chief complaint on file.   HISTORY OF PRESENT ILLNESS: Melissa Donovan is a 45 y.o. female who presents to the clinic today for:   pt is here on the referral of Dr. Posey Pronto for concern of CME OU, pt moved here from [place], but has a complex medical history and still sees her drs in [place], pt has Lyme disease which was dx in 2019, pt has been seeing Dr. Duncan Dull in [place] since then, pt was on IV antibiotics for 8 months after dx, pt has no hx of any eye problems, she has never worn glasses, pt has been taking acetazolamide for the past 6 days and yesterday she was put back on doxycyline yesterday, but has not picked up the rx yet, pt states her exam with Dr. Posey Pronto yesterday was just a routine eye exam, pt states she has had on and off blurriness since being dx with Lyme disease, she states right now she feels like her right eye is blurry, pt endorses being light sensitive  Referring physician: Jefm Petty, MD [address] [place],  [place] [address]  HISTORICAL INFORMATION:   Selected notes from the [facility] Referred by Dr. Virgina Evener for concern of CME OU   CURRENT MEDICATIONS: No current outpatient medications on file. (Ophthalmic Drugs)   No current facility-administered medications for this visit. (Ophthalmic Drugs)   Current Outpatient Medications (Other)  Medication Sig  . acetaZOLAMIDE (DIAMOX) 500 MG capsule Take 500 mg by mouth once.  Marland Kitchen albuterol (VENTOLIN HFA) 108 (90 Base) MCG/ACT inhaler Inhale into the lungs every 6 (six) hours as needed for wheezing or shortness of breath.  . ALPRAZolam (XANAX) 0.25 MG tablet Take 0.25 mg by mouth at bedtime as needed for anxiety.  Marland Kitchen amLODipine-benazepril (LOTREL) 10-20 MG capsule Take 1 capsule by mouth daily.  Marland Kitchen amoxicillin (AMOXIL) 125 MG/5ML suspension Take by mouth 3 (three) times daily.  Marland Kitchen  atorvastatin (LIPITOR) 10 MG tablet Take 10 mg by mouth daily.  Marland Kitchen atovaquone (MEPRON) 750 MG/5ML suspension Take by mouth daily.  Marland Kitchen azithromycin (ZITHROMAX) 250 MG tablet Take by mouth daily.  . benzonatate (TESSALON) 200 MG capsule Take 200 mg by mouth 3 (three) times daily as needed for cough.  . budesonide (PULMICORT) 0.5 MG/2ML nebulizer solution Take 0.5 mg by nebulization 2 (two) times daily.  Marland Kitchen buPROPion (ZYBAN) 150 MG 12 hr tablet Take 150 mg by mouth 2 (two) times daily.  . Ciprofloxacin-Ciproflox HCl 500 MG tablet Take 500 mg by mouth daily.  . clarithromycin (BIAXIN) 500 MG tablet Take 500 mg by mouth 2 (two) times daily.  . clindamycin (CLEOCIN) 150 MG capsule Take by mouth 3 (three) times daily.  . cyclobenzaprine (FLEXERIL) 10 MG tablet Take 10 mg by mouth 3 (three) times daily as needed. pain  . dextroamphetamine (DEXTROSTAT) 10 MG tablet Take 10 mg by mouth daily.  . diazepam (VALIUM) 2 MG tablet Take 2 mg by mouth every 6 (six) hours as needed for anxiety.  . diclofenac (VOLTAREN) 75 MG EC tablet Take 75 mg by mouth 2 (two) times daily.  Marland Kitchen disulfiram (ANTABUSE) 250 MG tablet Take 250 mg by mouth daily.  . DULoxetine (CYMBALTA) 30 MG capsule Take 30 mg by mouth daily.  . ergocalciferol (VITAMIN D2) 1.25 MG (50000 UT) capsule Take 50,000 Units by mouth once a week.  . etodolac (LODINE) 400  MG tablet Take 400 mg by mouth 2 (two) times daily.  . Fenoprofen Calcium (NALFON) 200 MG CAPS capsule Take 200 mg by mouth in the morning and at bedtime.  . fluticasone (FLOVENT HFA) 44 MCG/ACT inhaler Inhale 2 puffs into the lungs 2 (two) times daily.  Marland Kitchen gabapentin (NEURONTIN) 800 MG tablet Take 800 mg by mouth 3 (three) times daily.  . Glycopyrrolate-Formoterol (BEVESPI AEROSPHERE) 9-4.8 MCG/ACT AERO Inhale into the lungs.  Marland Kitchen ipratropium-albuterol (DUONEB) 0.5-2.5 (3) MG/3ML SOLN Take 3 mLs by nebulization.  Marland Kitchen ketorolac (TORADOL) 10 MG tablet Take 10 mg by mouth every 6 (six) hours as  needed.  . Levonorgestrel-Ethinyl Estradiol (DAYSEE) 0.15-0.03 &0.01 MG tablet Take 1 tablet by mouth daily.  Marland Kitchen LORazepam (ATIVAN) 0.5 MG tablet Take 0.5 mg by mouth every 8 (eight) hours.  Marland Kitchen losartan (COZAAR) 25 MG tablet Take 25 mg by mouth daily.  . meloxicam (MOBIC) 15 MG tablet Take 15 mg by mouth daily.  . memantine (NAMENDA) 10 MG tablet Take 10 mg by mouth 2 (two) times daily.  . metaxalone (SKELAXIN) 800 MG tablet Take 800 mg by mouth 3 (three) times daily.  . methocarbamol (ROBAXIN) 500 MG tablet Take 500 mg by mouth 4 (four) times daily.  . naproxen (NAPROSYN) 500 MG tablet Take 1,000 mg by mouth 2 (two) times daily as needed. pain  . norethindrone (CAMILA) 0.35 MG tablet Take 1 tablet by mouth daily.  . predniSONE (DELTASONE) 20 MG tablet 2 tabs po daily x 3 days   No current facility-administered medications for this visit. (Other)   REVIEW OF SYSTEMS:  ALLERGIES No Known Allergies  PAST MEDICAL HISTORY Past Medical History:  Diagnosis Date  . Arthritis   . Asthma   . Attention deficit hyperactivity disorder   . Fibromyalgia   . Lyme disease    Past Surgical History:  Procedure Laterality Date  . BACK SURGERY    . CHOLECYSTECTOMY      FAMILY HISTORY No family history on file.  SOCIAL HISTORY Social History   Tobacco Use  . Smoking status: Never Smoker  . Smokeless tobacco: Never Used  Substance Use Topics  . Alcohol use: Yes  . 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 Retinal edema  H35.81   3. Lyme disease  A69.20   4. Essential hypertension  I10   5. Hypertensive retinopathy of both eyes  H35.033   6. Nuclear sclerosis of both eyes  H25.13   7. Glaucoma suspect of both eyes  H40.003    1,2. 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 Serita Grit office -- ?acetazolamide improving cystic edema  - discussed findings  - recommend monitoring for now  - f/u 2-3 wks  3. History of chronic Lyme Disease  - under the expert management of Dr. Duncan Dull  - no inflammation noted on dilated exam -- no anterior or posterior uveitis  - monitor  4,5.  Hypertensive retinopathy OU - discussed importance of tight BP control - monitor  6. Nuclear Sclerosis OU - The symptoms of cataract, surgical options, and treatments and risks were discussed with patient - discussed diagnosis and progression - not yet visually significant - monitor for now  7. Glaucoma Suspect OU  - +cupping (OD > OS)  - +fam hx  - IOP16,18  Ophthalmic Meds Ordered this visit:  No orders of the defined types were placed in this encounter.     No follow-ups on file.  There are no Patient Instructions on file for this visit.  Explained the diagnoses, plan, and follow up with the patient and they expressed understanding.  Patient expressed understanding of the importance of proper follow up care.   This document serves as a record of services personally performed by Gardiner Sleeper, MD, PhD. It was created on their behalf by Roselee Nova, COMT. The creation of this record is the provider's dictation and/or activities during the visit.  Electronically signed by: Roselee Nova, COMT 02/01/21 8:16 AM   Gardiner Sleeper, M.D., Ph.D. Diseases & Surgery of the Retina and Vitreous [facility] 1.26.22   Abbreviations: M myopia (nearsighted); A astigmatism; H hyperopia (farsighted); P presbyopia; Mrx spectacle prescription;  CTL contact lenses; OD right eye; OS left eye; OU both eyes  XT  exotropia; ET esotropia; PEK punctate epithelial keratitis; PEE punctate epithelial erosions; DES dry eye syndrome; MGD meibomian gland dysfunction; ATs artificial tears; PFAT's preservative free artificial tears; [place] nuclear sclerotic cataract; PSC posterior subcapsular cataract; ERM epi-retinal membrane; PVD posterior vitreous detachment; RD retinal detachment; DM diabetes mellitus; DR diabetic retinopathy; NPDR non-proliferative diabetic retinopathy; PDR proliferative diabetic retinopathy; CSME clinically significant macular edema; DME diabetic macular edema; dbh dot blot hemorrhages; CWS cotton wool spot; POAG primary open angle glaucoma; C/D cup-to-disc ratio; HVF humphrey visual field; GVF goldmann visual field; OCT optical coherence tomography; IOP intraocular pressure; BRVO Branch retinal vein occlusion; CRVO central retinal vein occlusion; CRAO central retinal artery occlusion; BRAO branch retinal artery occlusion; RT retinal tear; SB scleral buckle; PPV pars plana vitrectomy; VH Vitreous hemorrhage; PRP panretinal laser photocoagulation; IVK intravitreal kenalog; VMT vitreomacular traction; MH Macular hole;  NVD neovascularization of the disc; NVE neovascularization elsewhere; AREDS age related eye disease study; ARMD age related macular degeneration; POAG primary open angle glaucoma; EBMD epithelial/anterior basement membrane dystrophy; ACIOL anterior chamber intraocular lens; IOL intraocular lens; PCIOL posterior chamber intraocular lens; Phaco/IOL phacoemulsification with intraocular lens placement; [place] photorefractive keratectomy; LASIK laser assisted in situ keratomileusis; HTN hypertension; DM diabetes mellitus; COPD chronic obstructive pulmonary disease

## 2021-02-02 ENCOUNTER — Encounter (INDEPENDENT_AMBULATORY_CARE_PROVIDER_SITE_OTHER): Payer: Federal, State, Local not specified - PPO | Admitting: Ophthalmology

## 2021-02-07 NOTE — Progress Notes (Signed)
[facility] Note  02/09/2021     CHIEF COMPLAINT Patient presents for Retina Follow Up   HISTORY OF PRESENT ILLNESS: Melissa Donovan is a 45 y.o. female who presents to the clinic today for:   HPI    Retina Follow Up    Patient presents with  Other.  In both eyes.  This started weeks ago.  Severity is moderate.  Duration of weeks.  Since onset it is stable.  I, the attending physician,  performed the HPI with the patient and updated documentation appropriately.          Comments    Pt states vision is the same OU.  Pt denies eye pain or discomfort and denies any new or worsening floaters or fol OU.  Pt saw Dr. Posey Pronto yesterday.       Last edited by Bernarda Caffey, MD on 02/09/2021 12:54 PM. (History)    pt states she is still on doxycycline and acetazolamide, no change in vision, pt states since she was here last, she had bronchitis and was given 3 days of po prednisone  Referring physician: Jefm Petty, MD [address] [place],  [place] [address]  HISTORICAL INFORMATION:   Selected notes from the [facility] Referred by Dr. Virgina Evener for concern of CME OU   CURRENT MEDICATIONS: Current Outpatient Medications (Ophthalmic Drugs)  Medication Sig  . Bromfenac Sodium (PROLENSA) 0.07 % SOLN Place 1 drop into both eyes 4 (four) times daily.  . prednisoLONE acetate (PRED FORTE) 1 % ophthalmic suspension Place 1 drop into both eyes 4 (four) times daily.   No current facility-administered medications for this visit. (Ophthalmic Drugs)   Current Outpatient Medications (Other)  Medication Sig  . acetaZOLAMIDE (DIAMOX) 500 MG capsule Take 500 mg by mouth once.  Marland Kitchen albuterol (VENTOLIN HFA) 108 (90 Base) MCG/ACT inhaler Inhale into the lungs every 6 (six) hours as needed for wheezing or shortness of breath.  . ALPRAZolam (XANAX) 0.25 MG tablet Take 0.25 mg by mouth at bedtime as needed for anxiety.  Marland Kitchen amLODipine-benazepril  (LOTREL) 10-20 MG capsule Take 1 capsule by mouth daily.  Marland Kitchen amoxicillin (AMOXIL) 125 MG/5ML suspension Take by mouth 3 (three) times daily.  Marland Kitchen atorvastatin (LIPITOR) 10 MG tablet Take 10 mg by mouth daily.  Marland Kitchen atovaquone (MEPRON) 750 MG/5ML suspension Take by mouth daily.  Marland Kitchen azithromycin (ZITHROMAX) 250 MG tablet Take by mouth daily.  . benzonatate (TESSALON) 200 MG capsule Take 200 mg by mouth 3 (three) times daily as needed for cough.  . budesonide (PULMICORT) 0.5 MG/2ML nebulizer solution Take 0.5 mg by nebulization 2 (two) times daily.  Marland Kitchen buPROPion (ZYBAN) 150 MG 12 hr tablet Take 150 mg by mouth 2 (two) times daily.  . Ciprofloxacin-Ciproflox HCl 500 MG tablet Take 500 mg by mouth daily.  . clarithromycin (BIAXIN) 500 MG tablet Take 500 mg by mouth 2 (two) times daily.  . clindamycin (CLEOCIN) 150 MG capsule Take by mouth 3 (three) times daily.  . cyclobenzaprine (FLEXERIL) 10 MG tablet Take 10 mg by mouth 3 (three) times daily as needed. pain  . dextroamphetamine (DEXTROSTAT) 10 MG tablet Take 10 mg by mouth daily.  . diazepam (VALIUM) 2 MG tablet Take 2 mg by mouth every 6 (six) hours as needed for anxiety.  . diclofenac (VOLTAREN) 75 MG EC tablet Take 75 mg by mouth 2 (two) times daily.  Marland Kitchen disulfiram (ANTABUSE) 250 MG tablet Take 250 mg by mouth  daily.  . DULoxetine (CYMBALTA) 30 MG capsule Take 30 mg by mouth daily.  . ergocalciferol (VITAMIN D2) 1.25 MG (50000 UT) capsule Take 50,000 Units by mouth once a week.  . etodolac (LODINE) 400 MG tablet Take 400 mg by mouth 2 (two) times daily.  . Fenoprofen Calcium (NALFON) 200 MG CAPS capsule Take 200 mg by mouth in the morning and at bedtime.  . fluticasone (FLOVENT HFA) 44 MCG/ACT inhaler Inhale 2 puffs into the lungs 2 (two) times daily.  Marland Kitchen gabapentin (NEURONTIN) 800 MG tablet Take 800 mg by mouth 3 (three) times daily.  . Glycopyrrolate-Formoterol (BEVESPI AEROSPHERE) 9-4.8 MCG/ACT AERO Inhale into the lungs.  Marland Kitchen ipratropium-albuterol  (DUONEB) 0.5-2.5 (3) MG/3ML SOLN Take 3 mLs by nebulization.  Marland Kitchen ketorolac (TORADOL) 10 MG tablet Take 10 mg by mouth every 6 (six) hours as needed.  . Levonorgestrel-Ethinyl Estradiol (DAYSEE) 0.15-0.03 &0.01 MG tablet Take 1 tablet by mouth daily.  Marland Kitchen LORazepam (ATIVAN) 0.5 MG tablet Take 0.5 mg by mouth every 8 (eight) hours.  Marland Kitchen losartan (COZAAR) 25 MG tablet Take 25 mg by mouth daily.  . meloxicam (MOBIC) 15 MG tablet Take 15 mg by mouth daily.  . memantine (NAMENDA) 10 MG tablet Take 10 mg by mouth 2 (two) times daily.  . metaxalone (SKELAXIN) 800 MG tablet Take 800 mg by mouth 3 (three) times daily.  . methocarbamol (ROBAXIN) 500 MG tablet Take 500 mg by mouth 4 (four) times daily.  . naproxen (NAPROSYN) 500 MG tablet Take 1,000 mg by mouth 2 (two) times daily as needed. pain  . norethindrone (CAMILA) 0.35 MG tablet Take 1 tablet by mouth daily.  . predniSONE (DELTASONE) 20 MG tablet 2 tabs po daily x 3 days   No current facility-administered medications for this visit. (Other)   REVIEW OF SYSTEMS: ROS    Positive for: Eyes   Negative for: Constitutional, Gastrointestinal, Neurological, Skin, Genitourinary, Musculoskeletal, HENT, Endocrine, Cardiovascular, Respiratory, Psychiatric, Allergic/Imm, Heme/Lymph   Last edited by Doneen Poisson on 02/09/2021  9:32 AM. (History)     ALLERGIES No Known Allergies  PAST MEDICAL HISTORY Past Medical History:  Diagnosis Date  . Arthritis   . Asthma   . Attention deficit hyperactivity disorder   . Fibromyalgia   . Lyme disease    Past Surgical History:  Procedure Laterality Date  . BACK SURGERY    . CHOLECYSTECTOMY      FAMILY HISTORY History reviewed. No pertinent family history.  SOCIAL HISTORY Social History   Tobacco Use  . Smoking status: Never Smoker  . Smokeless tobacco: Never Used  Substance Use Topics  . Alcohol use: Yes  . Drug use: Never         OPHTHALMIC EXAM:  Base Eye Exam    Visual Acuity (Snellen -  Linear)      Right Left   Dist [place] 20/50 -1 20/20 -1   Dist ph  NI        Tonometry (Tonopen, 9:37 AM)      Right Left   Pressure 17 15       Pupils      Dark Light Shape React APD   Right 5 4 Round Brisk 0   Left 5 4 Round Brisk 0       Visual Fields      Left Right    Full Full       Extraocular Movement      Right Left    Full Full  Neuro/Psych    Oriented x3: Yes   Mood/Affect: Normal       Dilation    Both eyes: 1.0% Mydriacyl, 2.5% Phenylephrine @ 9:37 AM        Slit Lamp and Fundus Exam    Slit Lamp Exam      Right Left   Lids/Lashes Normal Normal   Conjunctiva/Sclera mild melanosis mild melanosis   Cornea Trace tear film debris Trace tear film debris, 1+ Punctate epithelial erosions   Anterior Chamber deep, clear, narrow temporal angle, no AC cell or flare deep, clear, narrow temporal angle, no AC cell or flare   Iris Round and dilated Round and dilated, +PPM   Lens 1+ Nuclear sclerosis 1+ Nuclear sclerosis   Vitreous Vitreous syneresis, mild cell/pigment, no snowballs Vitreous syneresis, mild cell/pigment, no snowballs       Fundus Exam      Right Left   Disc Pink and Sharp, +cupping, thin inferior rim Pink and Sharp, temporal PPA/PPP   C/D Ratio 0.75 0.7   Macula Flat, Blunted foveal reflex, mild RPE mottling, trace perifoveal cystic changes -- slighlty increased, no heme Flat, Blunted foveal reflex, RPE mottling, trace cystic changes IN to fovea - stable, no heme   Vessels Mild attenuated, mild Copper wiring attenuated, mild Copper wiring   Periphery Attached, White without pressure, scattered punctate focal pigment changes, no snowbanking Attached,  scattered punctate focal pigment changes, no snowbanking             IMAGING AND PROCEDURES  Imaging and Procedures for 02/09/2021  OCT, Retina - OU - Both Eyes       Right Eye Quality was good. Central Foveal Thickness: 268. Progression has worsened. Findings include normal foveal  contour, intraretinal fluid, no SRF, vitreomacular adhesion  (Interval increase in Perifoveal cystic changes).   Left Eye Quality was good. Central Foveal Thickness: 252. Progression has been stable. Findings include normal foveal contour, intraretinal fluid, no SRF (Persistent cystic changes IN macula).   Notes *Images captured and stored on drive  Diagnosis / Impression:  OD: NFP; Interval increase in Perifoveal cystic changes OS: NFP; Persistent cystic changes IN macula  Clinical management:  See below  Abbreviations: NFP - Normal foveal profile. CME - cystoid macular edema. PED - pigment epithelial detachment. IRF - intraretinal fluid. SRF - subretinal fluid. EZ - ellipsoid zone. ERM - epiretinal membrane. ORA - outer retinal atrophy. ORT - outer retinal tubulation. SRHM - subretinal hyper-reflective material. IRHM - intraretinal hyper-reflective material .                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 Retinal edema  H35.81 OCT, Retina - OU - Both Eyes  3. Lyme disease  A69.20   4. Essential hypertension  I10   5. Hypertensive retinopathy of both eyes  H35.033   6. Nuclear sclerosis of both eyes  H25.13   7. Glaucoma suspect of both eyes  H40.003    1,2. 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 History of chronic Lyme Disease  - under the expert management of Dr. Duncan Dull  - no inflammation noted on dilated exam -- no anterior or posterior uveitis  - monitor  4,5. Hypertensive retinopathy OU - discussed  importance of tight BP control - monitor  6. Nuclear Sclerosis OU - The symptoms of cataract, surgical options, and treatments and risks were discussed with patient - discussed diagnosis and progression - not yet visually significant - monitor for now  7. 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 prednisoLONE acetate (PRED FORTE) 1 % ophthalmic suspension    Sig: Place 1 drop into both eyes 4 (four) times daily.    Dispense:  15 mL    Refill:  0  . Bromfenac Sodium (PROLENSA) 0.07 % SOLN    Sig: Place 1 drop into both eyes 4 (four) times daily.    Dispense:  6 mL    Refill:  3      Return in about 4 weeks (around 03/09/2021) for f/u retinoschisis OU, DFE, OCT.  There are no Patient Instructions on file for this visit.  Explained the diagnoses, plan, and follow up with the patient and they expressed understanding.  Patient expressed understanding of the importance of proper follow up care.   This document serves as a record of services personally performed by Gardiner Sleeper, MD, PhD. It was created on their behalf by Roselee Nova, COMT. The creation of this record is the provider's dictation and/or activities during the visit.  Electronically signed by: Roselee Nova, COMT 02/09/21 1:04 PM   This document serves as a record of services personally performed by Gardiner Sleeper, MD, PhD. It was created on their behalf by San Jetty. Owens Shark, OA an ophthalmic technician. The creation of this record is the provider's dictation and/or activities during the visit.    Electronically signed by: San Jetty. Owens Shark, [place] 02.16.2022 1:04 PM  Gardiner Sleeper, M.D., Ph.D. Diseases & Surgery of the Retina and Vitreous [facility]  I have reviewed the above documentation for accuracy and completeness, and I agree with the above. Gardiner Sleeper, M.D., Ph.D. 02/09/21 1:04  PM   Abbreviations: M myopia (nearsighted); A astigmatism; H hyperopia (farsighted); P presbyopia; Mrx spectacle prescription;  CTL contact lenses; OD right eye; OS left eye; OU both eyes  XT exotropia; ET esotropia; PEK punctate epithelial keratitis; PEE punctate epithelial erosions; DES dry eye syndrome; MGD meibomian gland dysfunction; ATs artificial tears; PFAT's preservative free artificial tears; [place] nuclear sclerotic cataract; PSC posterior subcapsular cataract; ERM epi-retinal membrane; PVD posterior vitreous detachment; RD retinal detachment; DM diabetes mellitus; DR diabetic retinopathy; NPDR non-proliferative diabetic retinopathy; PDR proliferative diabetic retinopathy; CSME clinically significant macular edema; DME diabetic macular edema; dbh dot blot hemorrhages; CWS cotton wool spot; POAG primary open angle glaucoma; C/D cup-to-disc ratio; HVF humphrey visual field; GVF goldmann visual field; OCT optical coherence tomography; IOP intraocular pressure; BRVO Branch retinal vein occlusion; CRVO central retinal vein occlusion; CRAO central retinal artery occlusion; BRAO branch retinal artery occlusion; RT retinal tear; SB scleral buckle; PPV pars plana vitrectomy; VH Vitreous hemorrhage; PRP panretinal laser photocoagulation; IVK intravitreal kenalog; VMT vitreomacular traction; MH Macular hole;  NVD neovascularization of the disc; NVE neovascularization elsewhere; AREDS age related eye disease study; ARMD age related macular degeneration; POAG primary open  angle glaucoma; EBMD epithelial/anterior basement membrane dystrophy; ACIOL anterior chamber intraocular lens; IOL intraocular lens; PCIOL posterior chamber intraocular lens; Phaco/IOL phacoemulsification with intraocular lens placement; [place] photorefractive keratectomy; LASIK laser assisted in situ keratomileusis; HTN hypertension; DM diabetes mellitus; COPD chronic obstructive pulmonary disease

## 2021-02-09 ENCOUNTER — Ambulatory Visit (INDEPENDENT_AMBULATORY_CARE_PROVIDER_SITE_OTHER): Payer: Medicare Other | Admitting: Ophthalmology

## 2021-02-09 ENCOUNTER — Encounter (INDEPENDENT_AMBULATORY_CARE_PROVIDER_SITE_OTHER): Payer: Self-pay | Admitting: Ophthalmology

## 2021-02-09 ENCOUNTER — Other Ambulatory Visit: Payer: Self-pay

## 2021-02-09 DIAGNOSIS — I10 Essential (primary) hypertension: Secondary | ICD-10-CM | POA: Diagnosis not present

## 2021-02-09 DIAGNOSIS — H33193 Other retinoschisis and retinal cysts, bilateral: Secondary | ICD-10-CM | POA: Diagnosis not present

## 2021-02-09 DIAGNOSIS — H3581 Retinal edema: Secondary | ICD-10-CM

## 2021-02-09 DIAGNOSIS — A6920 Lyme disease, unspecified: Secondary | ICD-10-CM | POA: Diagnosis not present

## 2021-02-09 DIAGNOSIS — H2513 Age-related nuclear cataract, bilateral: Secondary | ICD-10-CM

## 2021-02-09 DIAGNOSIS — H40003 Preglaucoma, unspecified, bilateral: Secondary | ICD-10-CM

## 2021-02-09 DIAGNOSIS — H35033 Hypertensive retinopathy, bilateral: Secondary | ICD-10-CM

## 2021-02-09 MED ORDER — PROLENSA 0.07 % OP SOLN: 1.0000 [drp] | Freq: Four times a day (QID) | OPHTHALMIC | 3 refills | Status: AC

## 2021-02-09 MED ORDER — PREDNISOLONE ACETATE 1 % OP SUSP: 1.0000 [drp] | Freq: Four times a day (QID) | OPHTHALMIC | 0 refills | Status: DC

## 2021-03-09 ENCOUNTER — Encounter (INDEPENDENT_AMBULATORY_CARE_PROVIDER_SITE_OTHER): Payer: Self-pay | Admitting: Ophthalmology

## 2021-03-09 ENCOUNTER — Ambulatory Visit (INDEPENDENT_AMBULATORY_CARE_PROVIDER_SITE_OTHER): Payer: Medicare Other | Admitting: Ophthalmology

## 2021-03-09 ENCOUNTER — Other Ambulatory Visit: Payer: Self-pay

## 2021-03-09 DIAGNOSIS — H33193 Other retinoschisis and retinal cysts, bilateral: Secondary | ICD-10-CM

## 2021-03-09 DIAGNOSIS — A6920 Lyme disease, unspecified: Secondary | ICD-10-CM | POA: Diagnosis not present

## 2021-03-09 DIAGNOSIS — H3581 Retinal edema: Secondary | ICD-10-CM

## 2021-03-09 DIAGNOSIS — I10 Essential (primary) hypertension: Secondary | ICD-10-CM

## 2021-03-09 DIAGNOSIS — H40003 Preglaucoma, unspecified, bilateral: Secondary | ICD-10-CM

## 2021-03-09 DIAGNOSIS — H35033 Hypertensive retinopathy, bilateral: Secondary | ICD-10-CM

## 2021-03-09 DIAGNOSIS — H2513 Age-related nuclear cataract, bilateral: Secondary | ICD-10-CM

## 2021-03-09 MED ORDER — PREDNISOLONE ACETATE 1 % OP SUSP: 1.0000 [drp] | Freq: Four times a day (QID) | OPHTHALMIC | 0 refills | Status: AC

## 2021-03-09 MED ORDER — PREDNISOLONE ACETATE 1 % OP SUSP: 1.0000 [drp] | Freq: Four times a day (QID) | OPHTHALMIC | 0 refills | Status: DC

## 2021-03-09 NOTE — Progress Notes (Signed)
[facility] Note  03/09/2021     CHIEF COMPLAINT Patient presents for Retina Follow Up   HISTORY OF PRESENT ILLNESS: Melissa Donovan is a 45 y.o. female who presents to the clinic today for:   HPI    Retina Follow Up    Patient presents with  Other.  In both eyes.  This started 4 weeks ago.  I, the attending physician,  performed the HPI with the patient and updated documentation appropriately.          Comments    Patient here for 4 weeks retina follow up for retinoschisis OU. Patient states vision distance is pretty good. Vision close up cant see most things close up - blotchy. No eye pain. Added supplements zinc, folic acid, vit D. Hair, nail and skin. Glotathione, Iron, and [place].       Last edited by Bernarda Caffey, MD on 03/09/2021  4:22 PM. (History)    pt states she has been using PF and Prolensa QID OU, but just got the Prolensa about a week ago due to insurance, she took doxycycline for 30 days  Referring physician: No referring provider defined for this encounter.  HISTORICAL INFORMATION:   Selected notes from the MEDICAL RECORD NUMBER Referred by Dr. Virgina Evener for concern of CME OU   CURRENT MEDICATIONS: Current Outpatient Medications (Ophthalmic Drugs)  Medication Sig  . Bromfenac Sodium (PROLENSA) 0.07 % SOLN Place 1 drop into both eyes 4 (four) times daily.  . prednisoLONE acetate (PRED FORTE) 1 % ophthalmic suspension Place 1 drop into both eyes 4 (four) times daily.   No current facility-administered medications for this visit. (Ophthalmic Drugs)   Current Outpatient Medications (Other)  Medication Sig  . acetaZOLAMIDE (DIAMOX) 500 MG capsule Take 500 mg by mouth once.  Marland Kitchen albuterol (VENTOLIN HFA) 108 (90 Base) MCG/ACT inhaler Inhale into the lungs every 6 (six) hours as needed for wheezing or shortness of breath.  . ALPRAZolam (XANAX) 0.25 MG tablet Take 0.25 mg by mouth at bedtime as needed for anxiety.  Marland Kitchen  amLODipine-benazepril (LOTREL) 10-20 MG capsule Take 1 capsule by mouth daily.  Marland Kitchen amoxicillin (AMOXIL) 125 MG/5ML suspension Take by mouth 3 (three) times daily.  Marland Kitchen atorvastatin (LIPITOR) 10 MG tablet Take 10 mg by mouth daily.  Marland Kitchen atovaquone (MEPRON) 750 MG/5ML suspension Take by mouth daily.  Marland Kitchen azithromycin (ZITHROMAX) 250 MG tablet Take by mouth daily.  . benzonatate (TESSALON) 200 MG capsule Take 200 mg by mouth 3 (three) times daily as needed for cough.  . budesonide (PULMICORT) 0.5 MG/2ML nebulizer solution Take 0.5 mg by nebulization 2 (two) times daily.  Marland Kitchen buPROPion (ZYBAN) 150 MG 12 hr tablet Take 150 mg by mouth 2 (two) times daily.  . Ciprofloxacin-Ciproflox HCl 500 MG tablet Take 500 mg by mouth daily.  . clarithromycin (BIAXIN) 500 MG tablet Take 500 mg by mouth 2 (two) times daily.  . clindamycin (CLEOCIN) 150 MG capsule Take by mouth 3 (three) times daily.  . cyclobenzaprine (FLEXERIL) 10 MG tablet Take 10 mg by mouth 3 (three) times daily as needed. pain  . dextroamphetamine (DEXTROSTAT) 10 MG tablet Take 10 mg by mouth daily.  . diazepam (VALIUM) 2 MG tablet Take 2 mg by mouth every 6 (six) hours as needed for anxiety.  . diclofenac (VOLTAREN) 75 MG EC tablet Take 75 mg by mouth 2 (two) times daily.  Marland Kitchen disulfiram (ANTABUSE) 250 MG tablet Take 250 mg by mouth daily.  Marland Kitchen  DULoxetine (CYMBALTA) 30 MG capsule Take 30 mg by mouth daily.  . ergocalciferol (VITAMIN D2) 1.25 MG (50000 UT) capsule Take 50,000 Units by mouth once a week.  . etodolac (LODINE) 400 MG tablet Take 400 mg by mouth 2 (two) times daily.  . Fenoprofen Calcium (NALFON) 200 MG CAPS capsule Take 200 mg by mouth in the morning and at bedtime.  . fluticasone (FLOVENT HFA) 44 MCG/ACT inhaler Inhale 2 puffs into the lungs 2 (two) times daily.  Marland Kitchen gabapentin (NEURONTIN) 800 MG tablet Take 800 mg by mouth 3 (three) times daily.  . Glycopyrrolate-Formoterol (BEVESPI AEROSPHERE) 9-4.8 MCG/ACT AERO Inhale into the lungs.  Marland Kitchen  ipratropium-albuterol (DUONEB) 0.5-2.5 (3) MG/3ML SOLN Take 3 mLs by nebulization.  Marland Kitchen ketorolac (TORADOL) 10 MG tablet Take 10 mg by mouth every 6 (six) hours as needed.  . Levonorgestrel-Ethinyl Estradiol (DAYSEE) 0.15-0.03 &0.01 MG tablet Take 1 tablet by mouth daily.  Marland Kitchen LORazepam (ATIVAN) 0.5 MG tablet Take 0.5 mg by mouth every 8 (eight) hours.  Marland Kitchen losartan (COZAAR) 25 MG tablet Take 25 mg by mouth daily.  . meloxicam (MOBIC) 15 MG tablet Take 15 mg by mouth daily.  . memantine (NAMENDA) 10 MG tablet Take 10 mg by mouth 2 (two) times daily.  . metaxalone (SKELAXIN) 800 MG tablet Take 800 mg by mouth 3 (three) times daily.  . methocarbamol (ROBAXIN) 500 MG tablet Take 500 mg by mouth 4 (four) times daily.  . naproxen (NAPROSYN) 500 MG tablet Take 1,000 mg by mouth 2 (two) times daily as needed. pain  . norethindrone (CAMILA) 0.35 MG tablet Take 1 tablet by mouth daily.  . predniSONE (DELTASONE) 20 MG tablet 2 tabs po daily x 3 days   No current facility-administered medications for this visit. (Other)   REVIEW OF SYSTEMS: ROS    Positive for: Eyes   Negative for: Constitutional, Gastrointestinal, Neurological, Skin, Genitourinary, Musculoskeletal, HENT, Endocrine, Cardiovascular, Respiratory, Psychiatric, Allergic/Imm, Heme/Lymph   Last edited by Theodore Demark, COA on 03/09/2021  2:54 PM. (History)     ALLERGIES No Known Allergies  PAST MEDICAL HISTORY Past Medical History:  Diagnosis Date  . Arthritis   . Asthma   . Attention deficit hyperactivity disorder   . Fibromyalgia   . Lyme disease    Past Surgical History:  Procedure Laterality Date  . BACK SURGERY    . CHOLECYSTECTOMY      FAMILY HISTORY History reviewed. No pertinent family history.  SOCIAL HISTORY Social History   Tobacco Use  . Smoking status: Never Smoker  . Smokeless tobacco: Never Used  Substance Use Topics  . Alcohol use: Yes  . Drug use: Never         OPHTHALMIC EXAM:  Base Eye Exam     Visual Acuity (Snellen - Linear)      Right Left   Dist [place] 20/50 20/20   Dist ph [place] NI   OD has to look to side to see center letter.       Tonometry (Tonopen, 2:50 PM)      Right Left   Pressure 17 16       Pupils      Dark Light Shape React APD   Right 5 4 Round Brisk None   Left 5 4 Round Brisk None       Visual Fields (Counting fingers)      Left Right    Full Full       Extraocular Movement  Right Left    Full Full       Neuro/Psych    Oriented x3: Yes   Mood/Affect: Normal       Dilation    Both eyes: 1.0% Mydriacyl, 2.5% Phenylephrine @ 2:49 PM        Slit Lamp and Fundus Exam    Slit Lamp Exam      Right Left   Lids/Lashes Normal Normal   Conjunctiva/Sclera mild melanosis mild melanosis   Cornea Trace tear film debris Trace tear film debris, 1+ Punctate epithelial erosions   Anterior Chamber deep, clear, narrow temporal angle, no AC cell or flare deep, clear, narrow temporal angle, no AC cell or flare   Iris Round and dilated Round and dilated, +PPM   Lens 1+ Nuclear sclerosis 1+ Nuclear sclerosis   Vitreous Vitreous syneresis, no cell/pigment, no snowballs Vitreous syneresis, no cell/pigment, no snowballs       Fundus Exam      Right Left   Disc Pink and Sharp, +cupping, thin inferior rim, temporal PPA Pink and Sharp, temporal PPA/PPP, +cupping   C/D Ratio 0.6 0.7   Macula Flat, Blunted foveal reflex, mild RPE mottling, trace perifoveal cystic changes -- slighlty improved, no heme Flat, Blunted foveal reflex, RPE mottling, trace cystic changes IN to fovea - stable, no heme   Vessels Mild attenuated, mild Copper wiring attenuated, mild AV crossing changes   Periphery Attached, White without pressure, scattered punctate focal pigment changes, no snowbanking Attached, scattered punctate focal pigment changes, no snowbanking             IMAGING AND PROCEDURES  Imaging and Procedures for 03/09/2021  OCT, Retina - OU - Both Eyes        Right Eye Quality was good. Central Foveal Thickness: 275. Progression has improved. Findings include normal foveal contour, intraretinal fluid, no SRF, vitreomacular adhesion  (Mild Interval improvement in Perifoveal cystic changes).   Left Eye Quality was good. Central Foveal Thickness: 255. Progression has been stable. Findings include normal foveal contour, intraretinal fluid, no SRF (Persistent cystic changes IN macula).   Notes *Images captured and stored on drive  Diagnosis / Impression:  OD: NFP; Mild Interval improvement in Perifoveal cystic changes OS: NFP; Persistent cystic changes IN macula  Clinical management:  See below  Abbreviations: NFP - Normal foveal profile. CME - cystoid macular edema. PED - pigment epithelial detachment. IRF - intraretinal fluid. SRF - subretinal fluid. EZ - ellipsoid zone. ERM - epiretinal membrane. ORA - outer retinal atrophy. ORT - outer retinal tubulation. SRHM - subretinal hyper-reflective material. IRHM - intraretinal hyper-reflective material .                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 Retinal edema  H35.81 OCT, Retina - OU - Both Eyes  3. Lyme disease  A69.20   4. Essential hypertension  I10   5. Hypertensive retinopathy of both eyes  H35.033   6. Nuclear sclerosis of both eyes  H25.13   7. Glaucoma suspect of both eyes  H40.003    1,2. 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 Manuella Ghazi at [facility] for second opinion of cystic changes/schisis in setting of chronic Lyme Disease  3. History of chronic Lyme Disease  - under the expert management of Dr. Duncan Dull  - no inflammation noted on dilated exam -- no anterior or posterior uveitis  - recommend Uveitis eval with Dr. Manuella Ghazi  4,5. Hypertensive retinopathy OU - discussed importance of tight BP control - monitor  6. Nuclear Sclerosis OU - The symptoms of cataract, surgical options, and treatments and risks were discussed with patient - discussed diagnosis and progression - not yet visually significant - monitor for now  7. Glaucoma Suspect OU  - +cupping (OD > OS)  - +fam hx  -  IOP 17,16  Ophthalmic Meds Ordered this visit:  Meds ordered this encounter  Medications  . DISCONTD: prednisoLONE acetate (PRED FORTE) 1 % ophthalmic suspension    Sig: Place 1 drop into both eyes 4 (four) times daily.    Dispense:  15 mL    Refill:  0  . prednisoLONE acetate (PRED FORTE) 1 % ophthalmic suspension    Sig: Place 1 drop into both eyes 4 (four) times daily.    Dispense:  15 mL    Refill:  0      Return for f/u 2-3 months retinoschisis OU, DFE, OCT.  There are no Patient Instructions on file for this visit.  Explained the diagnoses, plan, and follow up with the patient and they expressed understanding.  Patient expressed understanding of the importance of proper follow up care.   This document serves as a record of services personally performed by Gardiner Sleeper, MD, PhD. It was created on their behalf by Roselee Nova, COMT. The creation of this record is the provider's dictation and/or activities during the visit.  Electronically signed by: Roselee Nova, COMT 03/09/21 4:27 PM   This document serves as a record of services personally performed by Gardiner Sleeper, MD, PhD. It was created on their behalf by San Jetty. Owens Shark, OA an ophthalmic technician. The creation of this record is the provider's  dictation and/or activities during the visit.    Electronically signed by: San Jetty. Owens Shark, [place] 03.16.2022 4:27 PM  Gardiner Sleeper, M.D., Ph.D. Diseases & Surgery of the Retina and Vitreous [facility]  I have reviewed the above documentation for accuracy and completeness, and I agree with the above. Gardiner Sleeper, M.D., Ph.D. 03/09/21 4:27 PM  Abbreviations: M myopia (nearsighted); A astigmatism; H hyperopia (farsighted); P presbyopia; Mrx spectacle prescription;  CTL contact lenses; OD right eye; OS left eye; OU both eyes  XT exotropia; ET esotropia; PEK punctate epithelial keratitis; PEE punctate epithelial erosions; DES dry eye syndrome; MGD meibomian gland dysfunction; ATs artificial tears; PFAT's preservative free artificial tears; [place] nuclear sclerotic cataract; PSC posterior subcapsular cataract; ERM epi-retinal membrane; PVD posterior vitreous detachment; RD retinal detachment; DM diabetes mellitus; DR diabetic retinopathy; NPDR non-proliferative diabetic retinopathy; PDR proliferative diabetic retinopathy; CSME clinically significant macular edema; DME diabetic macular edema; dbh dot blot hemorrhages; CWS cotton wool spot; POAG primary open angle glaucoma; C/D cup-to-disc ratio; HVF humphrey visual field; GVF goldmann visual field; OCT optical coherence tomography; IOP intraocular pressure; BRVO Branch retinal vein occlusion; CRVO central retinal vein  occlusion; CRAO central retinal artery occlusion; BRAO branch retinal artery occlusion; RT retinal tear; SB scleral buckle; PPV pars plana vitrectomy; VH Vitreous hemorrhage; PRP panretinal laser photocoagulation; IVK intravitreal kenalog; VMT vitreomacular traction; MH Macular hole;  NVD neovascularization of the disc; NVE neovascularization elsewhere; AREDS age related eye disease study; ARMD age related macular degeneration; POAG primary open angle glaucoma; EBMD epithelial/anterior basement membrane dystrophy; ACIOL  anterior chamber intraocular lens; IOL intraocular lens; PCIOL posterior chamber intraocular lens; Phaco/IOL phacoemulsification with intraocular lens placement; [place] photorefractive keratectomy; LASIK laser assisted in situ keratomileusis; HTN hypertension; DM diabetes mellitus; COPD chronic obstructive pulmonary disease

## 2021-06-01 ENCOUNTER — Encounter (INDEPENDENT_AMBULATORY_CARE_PROVIDER_SITE_OTHER): Payer: Medicare Other | Admitting: Ophthalmology

## 2021-06-01 DIAGNOSIS — A6920 Lyme disease, unspecified: Secondary | ICD-10-CM

## 2021-06-01 DIAGNOSIS — H35033 Hypertensive retinopathy, bilateral: Secondary | ICD-10-CM

## 2021-06-01 DIAGNOSIS — H2513 Age-related nuclear cataract, bilateral: Secondary | ICD-10-CM

## 2021-06-01 DIAGNOSIS — I10 Essential (primary) hypertension: Secondary | ICD-10-CM

## 2021-06-01 DIAGNOSIS — H3581 Retinal edema: Secondary | ICD-10-CM

## 2021-06-01 DIAGNOSIS — H40003 Preglaucoma, unspecified, bilateral: Secondary | ICD-10-CM

## 2021-06-01 DIAGNOSIS — H33193 Other retinoschisis and retinal cysts, bilateral: Secondary | ICD-10-CM

## 2021-08-22 ENCOUNTER — Other Ambulatory Visit: Payer: Self-pay

## 2021-08-22 ENCOUNTER — Ambulatory Visit
Admission: EM | Admit: 2021-08-22 | Discharge: 2021-08-22 | Disposition: A | Discharge status: Home / Self Care | Payer: Medicare Other | Attending: Emergency Medicine | Admitting: Emergency Medicine

## 2021-08-22 ENCOUNTER — Encounter: Payer: Self-pay | Admitting: Emergency Medicine

## 2021-08-22 ENCOUNTER — Ambulatory Visit (HOSPITAL_BASED_OUTPATIENT_CLINIC_OR_DEPARTMENT_OTHER)
Admission: RE | Admit: 2021-08-22 | Discharge: 2021-08-22 | Disposition: A | Discharge status: Home / Self Care | Payer: Medicare Other | Source: Ambulatory Visit | Attending: Emergency Medicine | Admitting: Emergency Medicine

## 2021-08-22 DIAGNOSIS — R059 Cough, unspecified: Secondary | ICD-10-CM

## 2021-08-22 DIAGNOSIS — J22 Unspecified acute lower respiratory infection: Secondary | ICD-10-CM | POA: Diagnosis not present

## 2021-08-22 MED ORDER — BENZONATATE 200 MG PO CAPS: 200.0000 mg | ORAL_CAPSULE | Freq: Three times a day (TID) | ORAL | 0 refills | Status: AC | PRN

## 2021-08-22 MED ORDER — IPRATROPIUM-ALBUTEROL 0.5-2.5 (3) MG/3ML IN SOLN: 3.0000 mL | Freq: Four times a day (QID) | RESPIRATORY_TRACT | 0 refills | Status: AC | PRN

## 2021-08-22 MED ORDER — AZITHROMYCIN 250 MG PO TABS: ORAL_TABLET | ORAL | 0 refills | Status: AC

## 2021-08-22 MED ORDER — ALBUTEROL SULFATE HFA 108 (90 BASE) MCG/ACT IN AERS: 1.0000 | INHALATION_SPRAY | Freq: Four times a day (QID) | RESPIRATORY_TRACT | 0 refills | Status: AC | PRN

## 2021-08-22 MED ORDER — GUAIFENESIN-CODEINE 100-10 MG/5ML PO SOLN: 5.0000 mL | Freq: Every evening | ORAL | 0 refills | Status: AC | PRN

## 2021-08-22 MED ORDER — PREDNISONE 50 MG PO TABS: 50.0000 mg | ORAL_TABLET | Freq: Every day | ORAL | 0 refills | Status: AC

## 2021-08-22 NOTE — Discharge Instructions (Addendum)
Begin course of azithromycin Prednisone daily x5 days-take with food and earlier in the day if possible Continue albuterol inhaler and nebulizers Tessalon/benzonatate every 8 hours for cough during the day Robitussin with codeine at bedtime May continue Mucinex during the day Please follow-up if not improving or worsening

## 2021-08-22 NOTE — ED Triage Notes (Signed)
Patient presents to [facility] for evaluation of cough x 1.5 months.  Has used home nebulizer without relief.  States hx of bronchitis and usually needs a steroid

## 2021-08-23 NOTE — ED Provider Notes (Signed)
[facility]    CSN: [id number] Arrival date & time: 08/22/21  1249      History   Chief Complaint Chief Complaint  Patient presents with   Cough    HPI Melissa Donovan is a 45 y.o. female history of asthma, presenting today for evaluation of cough.  Reports that she has had a cough for approximately 1.5 months.  Has been using inhalers and nebulizers without relief.  Has history of bronchitis and symptoms feel similar.  Symptoms initially in sinuses but this is improved.  Denies recent fevers.  Denies any change in energy level.  HPI  Past Medical History:  Diagnosis Date   Arthritis    Asthma    Attention deficit hyperactivity disorder    Fibromyalgia    Lyme disease     There are no problems to display for this patient.   Past Surgical History:  Procedure Laterality Date   BACK SURGERY     CHOLECYSTECTOMY      OB History   No obstetric history on file.      Home Medications    Prior to Admission medications   Medication Sig Start Date End Date Taking? Authorizing Provider  albuterol (VENTOLIN HFA) 108 (90 Base) MCG/ACT inhaler Inhale 1-2 puffs into the lungs every 6 (six) hours as needed for wheezing or shortness of breath. 08/22/21  Yes Jahon Bart C, PA-C  azithromycin (ZITHROMAX) 250 MG tablet Take first 2 tablets together, then 1 every day until finished. 08/22/21  Yes Makalah Asberry C, PA-C  benzonatate (TESSALON) 200 MG capsule Take 1 capsule (200 mg total) by mouth 3 (three) times daily as needed for up to 7 days for cough. 08/22/21 08/29/21 Yes Niel Peretti C, PA-C  guaiFENesin-codeine 100-10 MG/5ML syrup Take 5-10 mLs by mouth at bedtime as needed for cough. 08/22/21  Yes Yashika Mask C, PA-C  ipratropium-albuterol (DUONEB) 0.5-2.5 (3) MG/3ML SOLN Take 3 mLs by nebulization every 6 (six) hours as needed. 08/22/21  Yes Tammara Massing C, PA-C  predniSONE (DELTASONE) 50 MG tablet Take 1 tablet (50 mg total) by mouth daily with  breakfast for 5 days. 08/22/21 08/27/21 Yes Falisa Lamora C, PA-C  acetaZOLAMIDE (DIAMOX) 500 MG capsule Take 500 mg by mouth once.    [provider]  ALPRAZolam Duanne Moron) 0.25 MG tablet Take 0.25 mg by mouth at bedtime as needed for anxiety.    [provider]  amLODipine-benazepril (LOTREL) 10-20 MG capsule Take 1 capsule by mouth daily.    [provider]  atorvastatin (LIPITOR) 10 MG tablet Take 10 mg by mouth daily.    [provider]  atovaquone (MEPRON) 750 MG/5ML suspension Take by mouth daily.    [provider]  Bromfenac Sodium (PROLENSA) 0.07 % SOLN Place 1 drop into both eyes 4 (four) times daily. 02/09/21   Bernarda Caffey, MD  budesonide (PULMICORT) 0.5 MG/2ML nebulizer solution Take 0.5 mg by nebulization 2 (two) times daily.    [provider]  buPROPion (ZYBAN) 150 MG 12 hr tablet Take 150 mg by mouth 2 (two) times daily.    [provider]  Ciprofloxacin-Ciproflox HCl 500 MG tablet Take 500 mg by mouth daily.    [provider]  clindamycin (CLEOCIN) 150 MG capsule Take by mouth 3 (three) times daily.    [provider]  cyclobenzaprine (FLEXERIL) 10 MG tablet Take 10 mg by mouth 3 (three) times daily as needed. pain    [provider]  dextroamphetamine (DEXTROSTAT) 10  MG tablet Take 10 mg by mouth daily.    [provider]  diazepam (VALIUM) 2 MG tablet Take 2 mg by mouth every 6 (six) hours as needed for anxiety.    [provider]  diclofenac (VOLTAREN) 75 MG EC tablet Take 75 mg by mouth 2 (two) times daily.    [provider]  DULoxetine (CYMBALTA) 30 MG capsule Take 30 mg by mouth daily.    [provider]  ergocalciferol (VITAMIN D2) 1.25 MG (50000 UT) capsule Take 50,000 Units by mouth once a week.    [provider]  etodolac (LODINE) 400 MG tablet Take 400 mg by mouth 2 (two) times daily.    [provider]  Fenoprofen Calcium  (NALFON) 200 MG CAPS capsule Take 200 mg by mouth in the morning and at bedtime.    [provider]  fluticasone (FLOVENT HFA) 44 MCG/ACT inhaler Inhale 2 puffs into the lungs 2 (two) times daily. 05/10/20   Tacy Learn, PA-C  gabapentin (NEURONTIN) 800 MG tablet Take 800 mg by mouth 3 (three) times daily.    [provider]  Glycopyrrolate-Formoterol (BEVESPI AEROSPHERE) 9-4.8 MCG/ACT AERO Inhale into the lungs.    [provider]  ketorolac (TORADOL) 10 MG tablet Take 10 mg by mouth every 6 (six) hours as needed.    [provider]  Levonorgestrel-Ethinyl Estradiol (DAYSEE) 0.15-0.03 &0.01 MG tablet Take 1 tablet by mouth daily.    [provider]  LORazepam (ATIVAN) 0.5 MG tablet Take 0.5 mg by mouth every 8 (eight) hours.    [provider]  losartan (COZAAR) 25 MG tablet Take 25 mg by mouth daily.    [provider]  meloxicam (MOBIC) 15 MG tablet Take 15 mg by mouth daily.    [provider]  memantine (NAMENDA) 10 MG tablet Take 10 mg by mouth 2 (two) times daily.    [provider]  metaxalone (SKELAXIN) 800 MG tablet Take 800 mg by mouth 3 (three) times daily.    [provider]  methocarbamol (ROBAXIN) 500 MG tablet Take 500 mg by mouth 4 (four) times daily.    [provider]  naproxen (NAPROSYN) 500 MG tablet Take 1,000 mg by mouth 2 (two) times daily as needed. pain    [provider]  norethindrone (CAMILA) 0.35 MG tablet Take 1 tablet by mouth daily.    [provider]  prednisoLONE acetate (PRED FORTE) 1 % ophthalmic suspension Place 1 drop into both eyes 4 (four) times daily. 03/09/21   Bernarda Caffey, MD    Family History Family History  Problem Relation Age of Onset   Hypertension Mother    Diabetes Mother    Hypertension Father     Social History Social History   Tobacco Use   Smoking status: Never   Smokeless tobacco: Never  Substance Use Topics    Alcohol use: Yes   Drug use: Never     Allergies   Patient has no known allergies.   Review of Systems Review of Systems  Constitutional:  Negative for activity change, appetite change, chills, fatigue and fever.  HENT:  Positive for congestion. Negative for ear pain, rhinorrhea, sinus pressure, sore throat and trouble swallowing.   Eyes:  Negative for discharge and redness.  Respiratory:  Positive for cough, shortness of breath and wheezing. Negative for chest tightness.   Cardiovascular:  Negative for chest pain.  Gastrointestinal:  Negative for abdominal pain, diarrhea, nausea and vomiting.  Musculoskeletal:  Negative for myalgias.  Skin:  Negative for rash.  Neurological:  Negative for dizziness, light-headedness and headaches.    Physical Exam Triage Vital Signs ED Triage Vitals  Enc Vitals Group     BP 08/22/21 1426 130/85     Pulse Rate 08/22/21 1426 97     Resp 08/22/21 1426 18     Temp 08/22/21 1426 98.6 F (37 C)     Temp Source 08/22/21 1426 Oral     SpO2 08/22/21 1426 98 %     Weight --      Height --      Head Circumference --      Peak Flow --      Pain Score 08/22/21 1504 0     Pain Loc --      Pain Edu? --      Excl. in [place]? --    No data found.  Updated Vital Signs BP 130/85 (BP Location: Right Arm)   Pulse 97   Temp 98.6 F (37 C) (Oral)   Resp 18   LMP 07/24/2021   SpO2 98%   Visual Acuity Right Eye Distance:   Left Eye Distance:   Bilateral Distance:    Right Eye Near:   Left Eye Near:    Bilateral Near:     Physical Exam Vitals and nursing note reviewed.  Constitutional:      Appearance: She is well-developed.     Comments: No acute distress  HENT:     Head: Normocephalic and atraumatic.     Ears:     Comments: Bilateral ears without tenderness to palpation of external auricle, tragus and mastoid, EAC's without erythema or swelling, TM's with good bony landmarks and cone of light. Non erythematous.      Nose: Nose normal.      Mouth/Throat:     Comments: Oral mucosa pink and moist, no tonsillar enlargement or exudate. Posterior pharynx patent and nonerythematous, no uvula deviation or swelling. Normal phonation.  Eyes:     Conjunctiva/sclera: Conjunctivae normal.  Cardiovascular:     Rate and Rhythm: Normal rate.  Pulmonary:     Effort: Pulmonary effort is normal. No respiratory distress.     Comments: Coarse cough in room, bronchospasm with cough, otherwise lungs clear Abdominal:     General: There is no distension.  Musculoskeletal:        General: Normal range of motion.     Cervical back: Neck supple.  Skin:    General: Skin is warm and dry.  Neurological:     Mental Status: She is alert and oriented to person, place, and time.     UC Treatments / Results  Labs (all labs ordered are listed, but only abnormal results are displayed) Labs Reviewed - No data to display  EKG   Radiology DG Chest 2 View  Result Date: 08/22/2021 CLINICAL DATA:  Cough EXAM: CHEST - 2 VIEW COMPARISON:  01/27/2021 FINDINGS: The heart size and mediastinal contours are within normal limits. Both lungs are clear. The visualized skeletal structures are unremarkable. IMPRESSION: No active cardiopulmonary disease. Electronically Signed   By: Donavan Foil M.D.   On: 08/22/2021 15:35    Procedures Procedures (including critical care time)  Medications Ordered in UC Medications - No data to display  Initial Impression / Assessment and Plan / UC Course  I have reviewed the triage vital signs and the nursing notes.  Pertinent labs & imaging results that were available during my care  of the patient were reviewed by me and considered in my medical decision making (see chart for details).     Cough x1.5 months, chest x-ray pending, treating for bronchitis with prednisone course, covering atypicals with azithromycin, provided Tessalon for during the day, Robitussin with codeine at bedtime, continue with Mucinex,Discussed  strict return precautions. Patient verbalized understanding and is agreeable with plan.  Final Clinical Impressions(s) / UC Diagnoses   Final diagnoses:  Lower respiratory infection (e.g., bronchitis, pneumonia, pneumonitis, pulmonitis)  Cough     Discharge Instructions      Begin course of azithromycin Prednisone daily x5 days-take with food and earlier in the day if possible Continue albuterol inhaler and nebulizers Tessalon/benzonatate every 8 hours for cough during the day Robitussin with codeine at bedtime May continue Mucinex during the day Please follow-up if not improving or worsening   ED Prescriptions     Medication Sig Dispense Auth. Provider   benzonatate (TESSALON) 200 MG capsule Take 1 capsule (200 mg total) by mouth 3 (three) times daily as needed for up to 7 days for cough. 28 capsule Kanon Colunga C, PA-C   guaiFENesin-codeine 100-10 MG/5ML syrup Take 5-10 mLs by mouth at bedtime as needed for cough. 120 mL Adama Ferber C, PA-C   predniSONE (DELTASONE) 50 MG tablet Take 1 tablet (50 mg total) by mouth daily with breakfast for 5 days. 5 tablet Manley Fason C, PA-C   albuterol (VENTOLIN HFA) 108 (90 Base) MCG/ACT inhaler Inhale 1-2 puffs into the lungs every 6 (six) hours as needed for wheezing or shortness of breath. 1 each Kjell Brannen C, PA-C   ipratropium-albuterol (DUONEB) 0.5-2.5 (3) MG/3ML SOLN Take 3 mLs by nebulization every 6 (six) hours as needed. 360 mL Tyianna Menefee C, PA-C   azithromycin (ZITHROMAX) 250 MG tablet Take first 2 tablets together, then 1 every day until finished. 6 tablet Kynesha Guerin, [place] C, PA-C      PDMP not reviewed this encounter.   Joneen Caraway [place] C, [place] 08/23/21 [phone]

## 2022-03-19 IMAGING — DX DG CHEST 1V PORT
1 series · 1 of 1 positions shown · non-contrast
Comparison: 09/23/2011

CLINICAL DATA: Cough and shortness of breath

EXAM:
PORTABLE CHEST 1 VIEW

[chest ap]
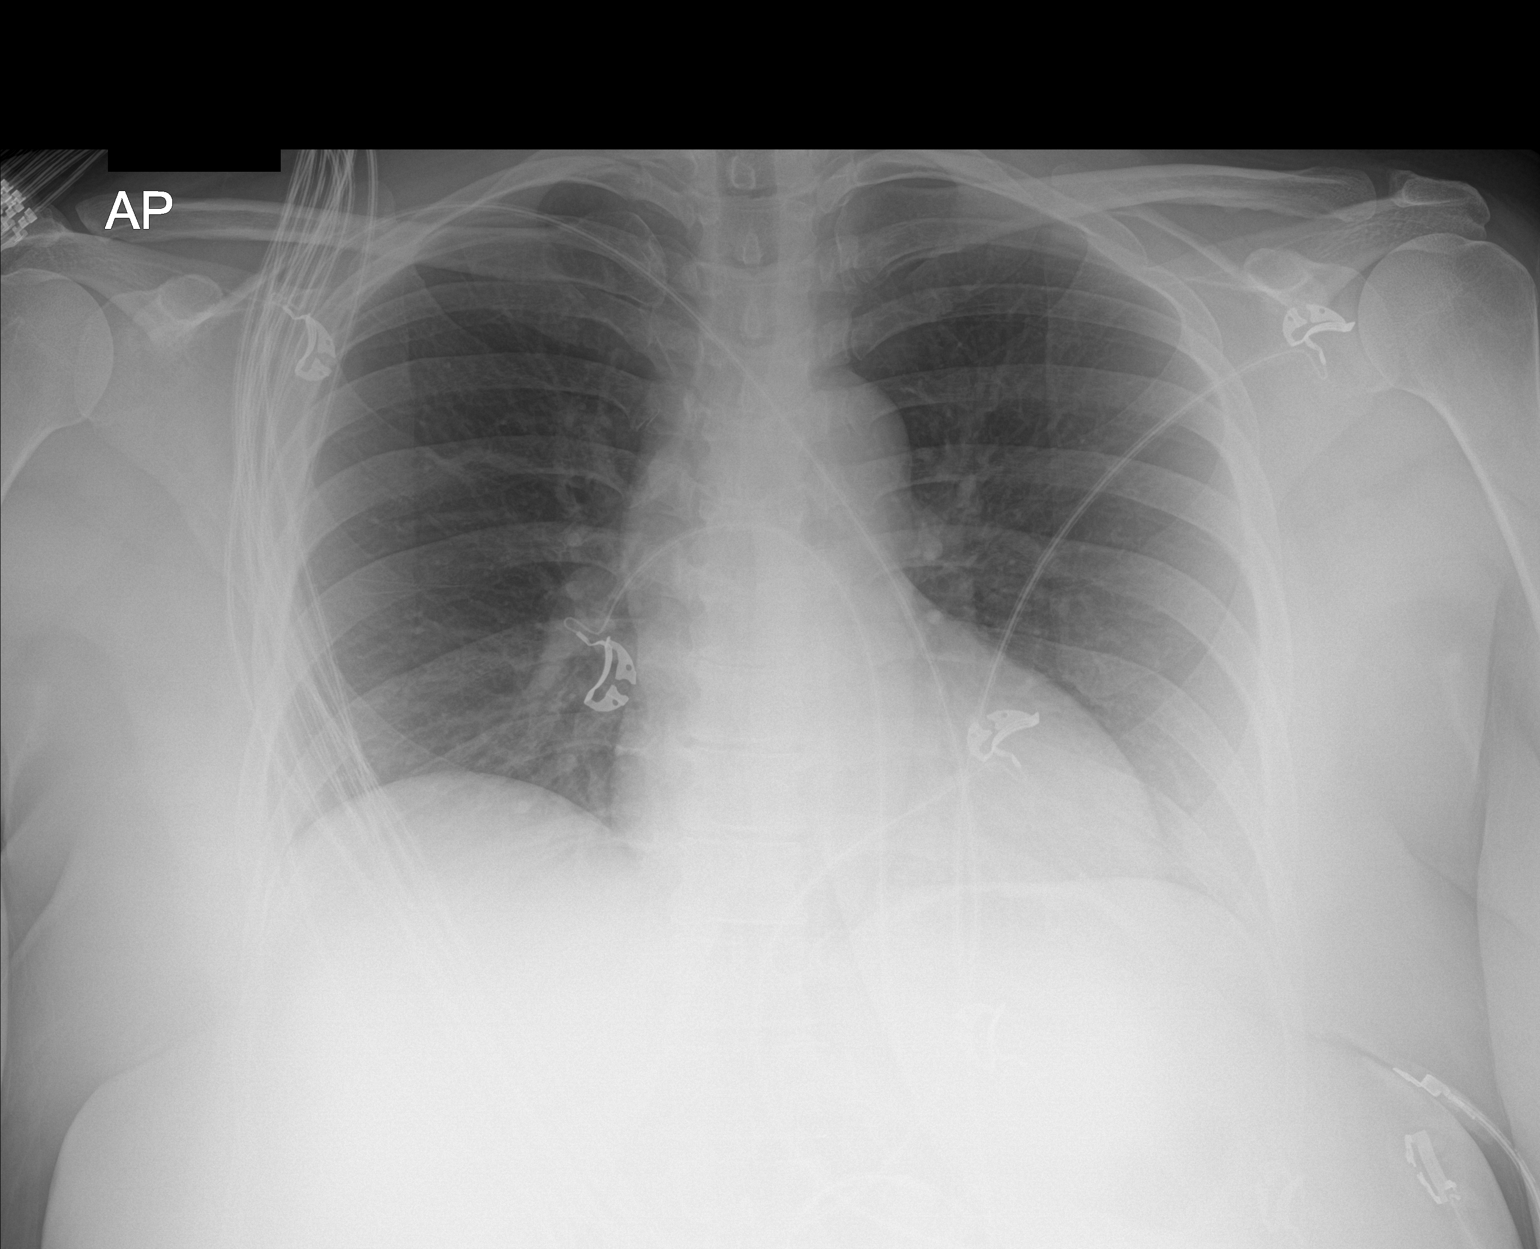

[1 of 1 positions shown; findings below may reference images not displayed]

FINDINGS: The heart size and mediastinal contours are within normal limits.
Both lungs are clear. The visualized skeletal structures are
unremarkable.
IMPRESSION: No active disease.

## 2022-03-19 IMAGING — CT CT ANGIO CHEST
2 of 9 series · 18 of 36 positions shown · IV contrast (Omnipaque)
Comparison: 09/23/2011

CLINICAL DATA: Shortness of breath, cough

EXAM:
CT ANGIOGRAPHY CHEST WITH CONTRAST
TECHNIQUE: Multidetector CT imaging of the chest was performed using the
standard protocol during bolus administration of intravenous
contrast. Multiplanar CT image reconstructions and MIPs were
obtained to evaluate the vascular anatomy.
CONTRAST:  100mL OMNIPAQUE IOHEXOL 350 MG/ML SOLN

[Series 7: pe coronal mpr · coronal · 0.46mm/px · 1 of 128 slices shown]
[im 64/128  mediastinal]
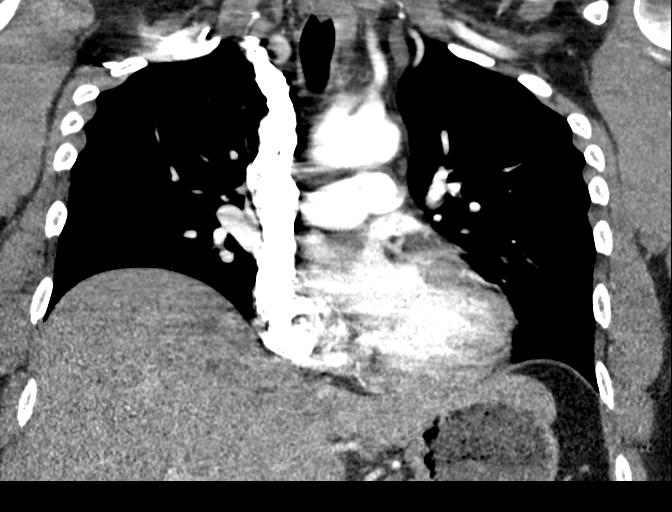

[Series 11: pe thins · axial · 0.66mm/px · z∈[-97,+111]mm · 17 of 235 slices shown]
[im 14/235  lung]
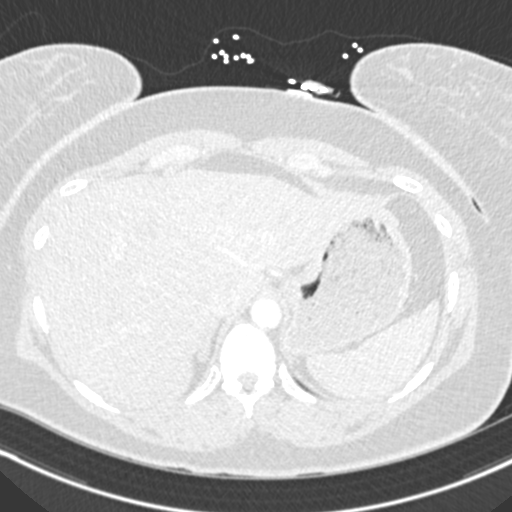
[im 27/235  mediastinal]
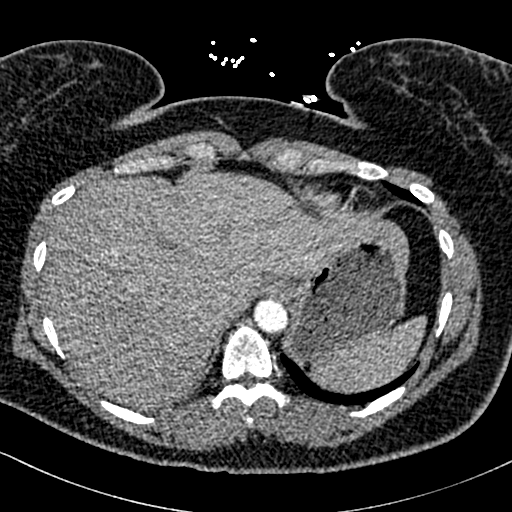
[im 40/235  lung]
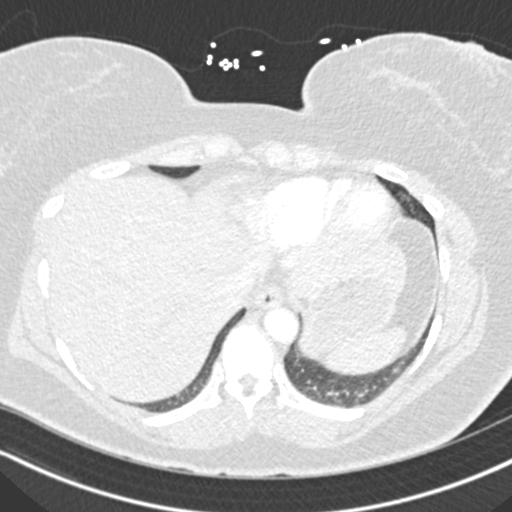
[im 53/235  mediastinal]
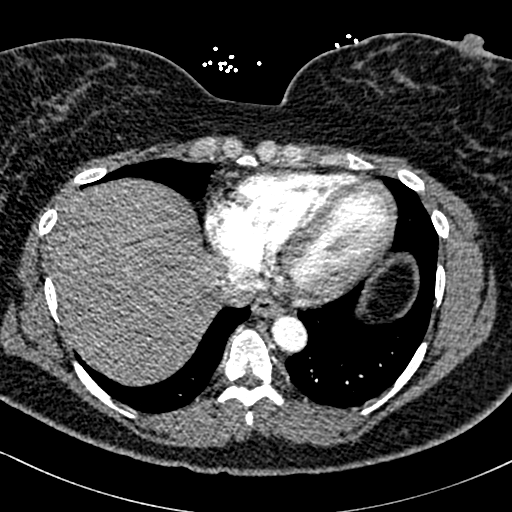
[im 66/235  lung]
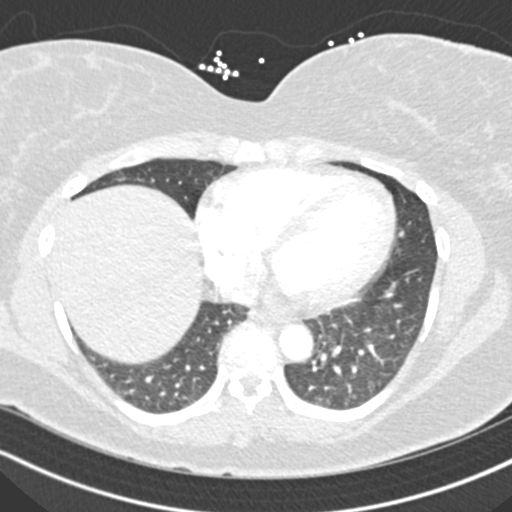
[im 79/235  mediastinal]
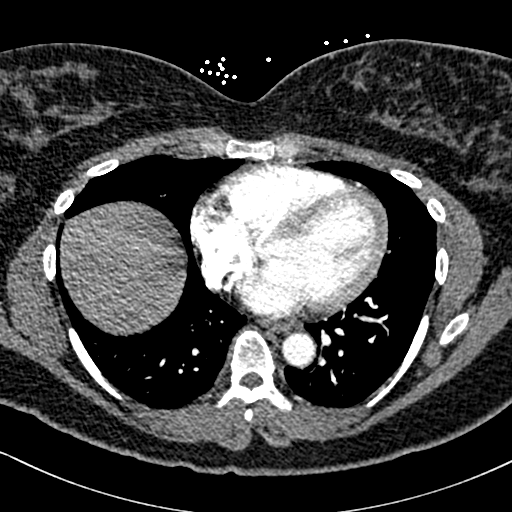
[im 92/235  lung]
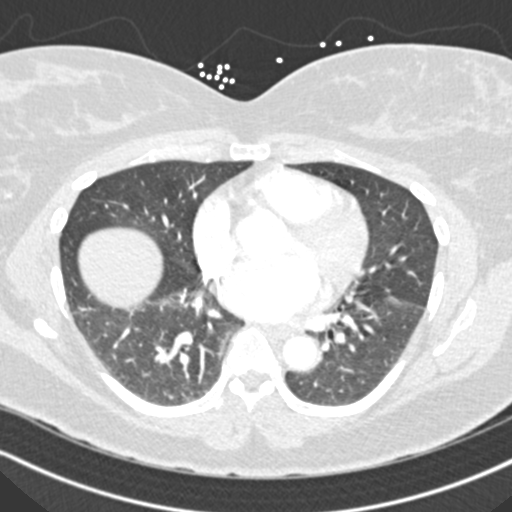
[im 105/235  mediastinal]
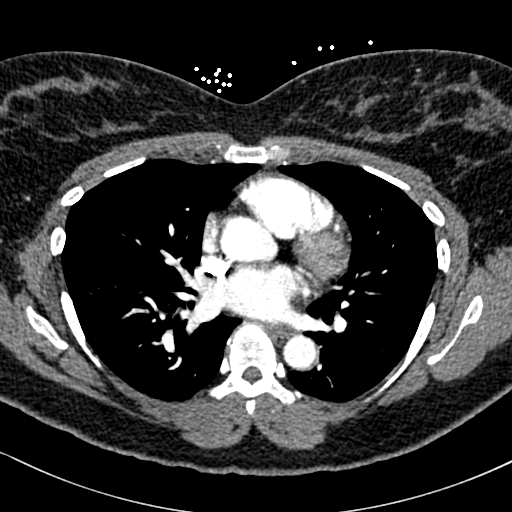
[im 118/235  lung]
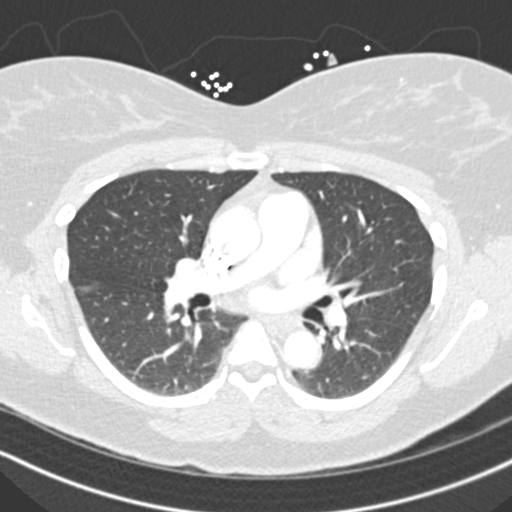
[im 131/235  mediastinal]
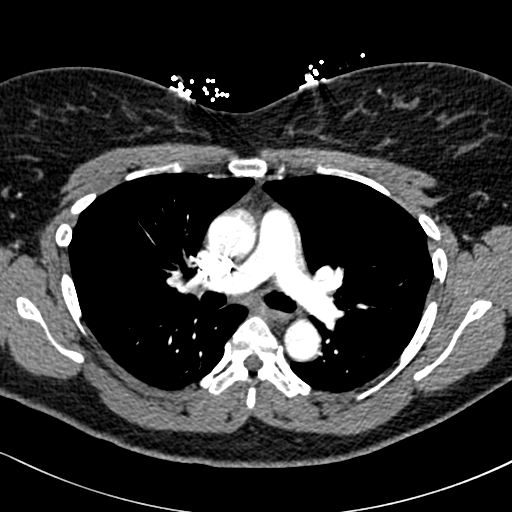
[im 144/235  lung]
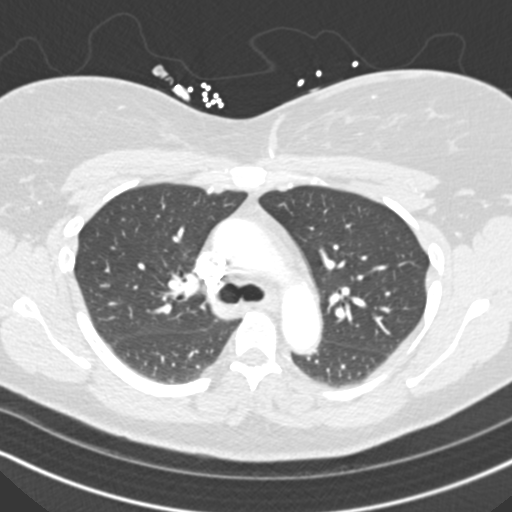
[im 157/235  mediastinal]
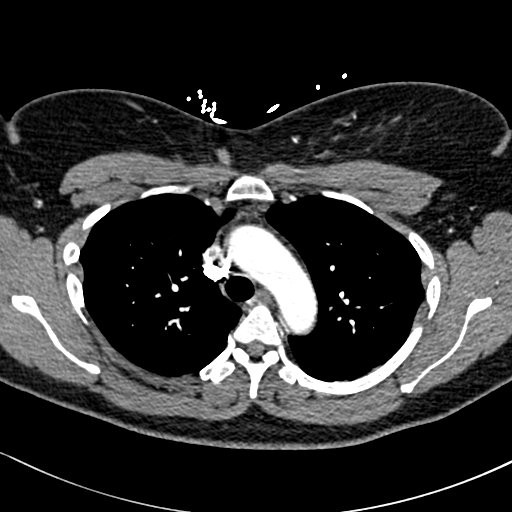
[im 170/235  lung]
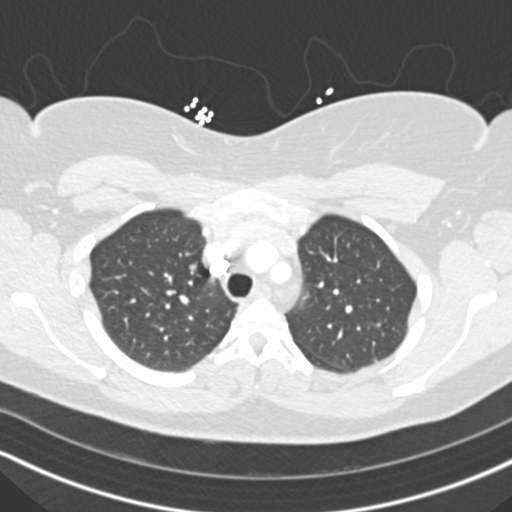
[im 183/235  mediastinal]
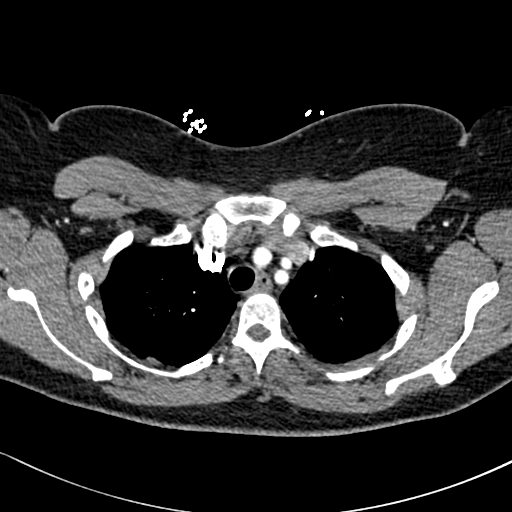
[im 196/235  lung]
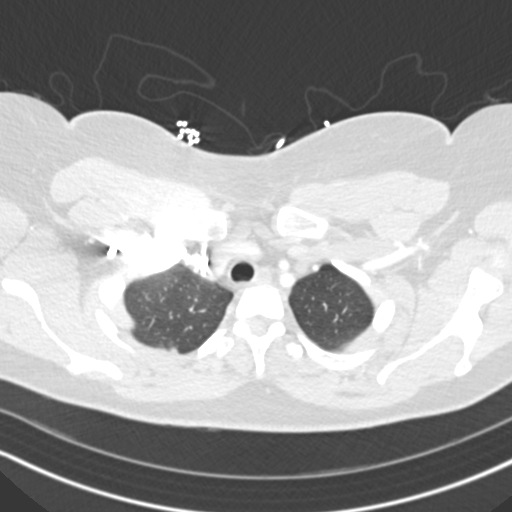
[im 209/235  mediastinal]
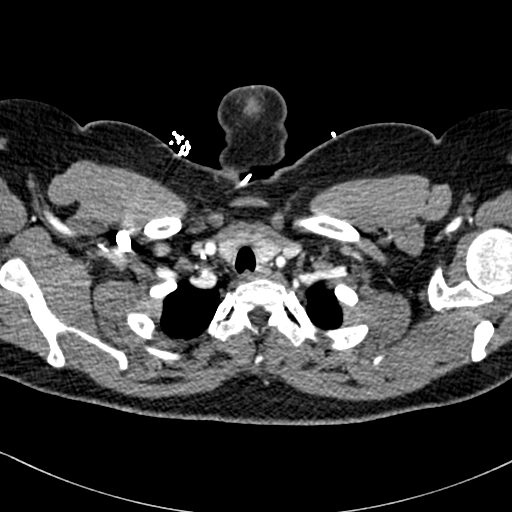
[im 222/235  lung]
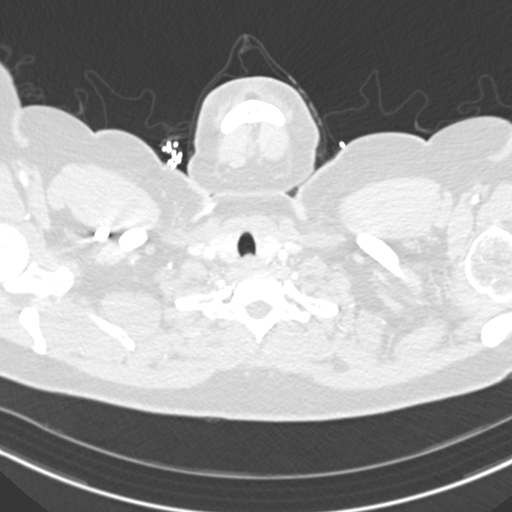

[18 of 36 positions shown; findings below may reference images not displayed]

FINDINGS: Cardiovascular: No filling defects in the pulmonary arteries to
suggest pulmonary emboli. Heart is normal size. Aorta normal
caliber. No dissection.

Mediastinum/Nodes: No mediastinal, hilar, or axillary adenopathy.
Trachea and esophagus are unremarkable. Thyroid unremarkable.

Lungs/Pleura: Lungs are clear. No focal airspace opacities or
suspicious nodules. No effusions.

Upper Abdomen: Imaging into the upper abdomen shows no acute
findings.

Musculoskeletal: Chest wall soft tissues are unremarkable. No acute
bony abnormality.

Review of the MIP images confirms the above findings.
IMPRESSION: No evidence of pulmonary embolus.

No acute cardiopulmonary disease.

## 2022-05-30 ENCOUNTER — Encounter (HOSPITAL_BASED_OUTPATIENT_CLINIC_OR_DEPARTMENT_OTHER): Payer: Self-pay | Admitting: Emergency Medicine

## 2022-05-30 ENCOUNTER — Emergency Department (HOSPITAL_BASED_OUTPATIENT_CLINIC_OR_DEPARTMENT_OTHER)
Admission: EM | Admit: 2022-05-30 | Discharge: 2022-05-31 | Disposition: A | Discharge status: Home / Self Care | Payer: Medicare Other | Attending: Emergency Medicine | Admitting: Emergency Medicine

## 2022-05-30 ENCOUNTER — Emergency Department (HOSPITAL_BASED_OUTPATIENT_CLINIC_OR_DEPARTMENT_OTHER): Payer: Medicare Other

## 2022-05-30 ENCOUNTER — Other Ambulatory Visit: Payer: Self-pay

## 2022-05-30 DIAGNOSIS — Z79899 Other long term (current) drug therapy: Secondary | ICD-10-CM | POA: Diagnosis not present

## 2022-05-30 DIAGNOSIS — Z7951 Long term (current) use of inhaled steroids: Secondary | ICD-10-CM | POA: Insufficient documentation

## 2022-05-30 DIAGNOSIS — D62 Acute posthemorrhagic anemia: Secondary | ICD-10-CM | POA: Insufficient documentation

## 2022-05-30 DIAGNOSIS — D259 Leiomyoma of uterus, unspecified: Secondary | ICD-10-CM | POA: Diagnosis not present

## 2022-05-30 DIAGNOSIS — R079 Chest pain, unspecified: Secondary | ICD-10-CM | POA: Diagnosis not present

## 2022-05-30 DIAGNOSIS — R102 Pelvic and perineal pain: Secondary | ICD-10-CM | POA: Diagnosis not present

## 2022-05-30 DIAGNOSIS — R0602 Shortness of breath: Secondary | ICD-10-CM | POA: Diagnosis not present

## 2022-05-30 DIAGNOSIS — J45909 Unspecified asthma, uncomplicated: Secondary | ICD-10-CM | POA: Diagnosis not present

## 2022-05-30 DIAGNOSIS — R531 Weakness: Secondary | ICD-10-CM | POA: Insufficient documentation

## 2022-05-30 DIAGNOSIS — N939 Abnormal uterine and vaginal bleeding, unspecified: Secondary | ICD-10-CM | POA: Insufficient documentation

## 2022-05-30 LAB — BASIC METABOLIC PANEL
Anion gap: 6 (ref 5–15)
BUN: 12 mg/dL (ref 6–20)
CO2: 24 mmol/L (ref 22–32)
Calcium: 9.6 mg/dL (ref 8.9–10.3)
Chloride: 110 mmol/L (ref 98–111)
Creatinine, Ser: 0.93 mg/dL (ref 0.44–1.00)
GFR, Estimated: >60 mL/min (ref >60)
Glucose, Bld: 92 mg/dL (ref 70–99)
Potassium: 3.9 mmol/L (ref 3.5–5.1)
Sodium: 140 mmol/L (ref 135–145)

## 2022-05-30 LAB — CBC WITH DIFFERENTIAL/PLATELET
Abs Immature Granulocytes: 0.02 10*3/uL (ref 0.00–0.07)
Basophils Absolute: 0 10*3/uL (ref 0.0–0.1)
Basophils Relative: 1 %
Eosinophils Absolute: 0.1 10*3/uL (ref 0.0–0.5)
Eosinophils Relative: 1 %
HCT: 31.8 % — ABNORMAL LOW (ref 36.0–46.0)
Hemoglobin: 10.2 g/dL — ABNORMAL LOW (ref 12.0–15.0)
Immature Granulocytes: 0 %
Lymphocytes Relative: 32 %
Lymphs Abs: 2.4 10*3/uL (ref 0.7–4.0)
MCH: 25.8 pg — ABNORMAL LOW (ref 26.0–34.0)
MCHC: 32.1 g/dL (ref 30.0–36.0)
MCV: 80.3 fL (ref 80.0–100.0)
Monocytes Absolute: 0.5 10*3/uL (ref 0.1–1.0)
Monocytes Relative: 6 %
Neutro Abs: 4.4 10*3/uL (ref 1.7–7.7)
Neutrophils Relative %: 60 %
Platelets: 308 10*3/uL (ref 150–400)
RBC: 3.96 MIL/uL (ref 3.87–5.11)
RDW: 14.6 % (ref 11.5–15.5)
WBC: 7.4 10*3/uL (ref 4.0–10.5)
nRBC: 0 % (ref 0.0–0.2)

## 2022-05-30 LAB — PREGNANCY, URINE: Preg Test, Ur: NEGATIVE

## 2022-05-30 LAB — TROPONIN I (HIGH SENSITIVITY): Troponin I (High Sensitivity): 2 ng/L (ref <18)

## 2022-05-30 MED ORDER — SODIUM CHLORIDE 0.9 % IV BOLUS
1000.0000 mL | Freq: Once | INTRAVENOUS | Status: AC
  Administered 2022-05-30: 1000 mL via INTRAVENOUS

## 2022-05-30 MED ORDER — KETOROLAC TROMETHAMINE 15 MG/ML IJ SOLN
15.0000 mg | Freq: Once | INTRAMUSCULAR | Status: AC
  Administered 2022-05-30: 15 mg via INTRAVENOUS
  Filled 2022-05-30: qty 1

## 2022-05-30 NOTE — ED Triage Notes (Signed)
Pt reports severe vaginal bleeding since march 23. Pt reports saturating ultra tampon and large pad within one hour pta. Has vaginal MRI scheduled for 6/16. Also reports shob, chest pain, passing large clot, and lower abdominal pain.

## 2022-05-30 NOTE — ED Provider Triage Note (Signed)
Emergency Medicine Provider Triage Evaluation Note  Wilsie Kern , a 46 y.o. female  was evaluated in triage.  Pt complains of chest pain, SOB, excessive vaginal bleeding. Patient states she is going through 1 pad every hour. Patient has history of excessive vaginal bleeding starting in 3/23. Patient has history of fibroids. Patient has been bleeding since 5/23. Patient states chest pain has been ongoing for 4 days, worse with exertion and inspiration. Patient states she has antiphospholipid syndrome, is not anticoagulated. Patient also has history of lyme disease and lyme carditis. Patient denies fevers, leg swelling. Patient also complaining of back pain and abdominal pain.  Review of Systems  Positive:  Negative:   Physical Exam  BP (!) 152/106 (BP Location: Left Arm)   Pulse (!) 106   Temp 99.6 F (37.6 C) (Oral)   Resp 18   Wt 102.1 kg   LMP 05/16/2022 (Exact Date)   SpO2 100%   BMI 34.21 kg/m  Gen:   Awake, no distress   Resp:  Normal effort  MSK:   Moves extremities without difficulty  Other:  Lung sound clear. No LE swelling.  Medical Decision Making  Medically screening exam initiated at 8:48 PM.  Appropriate orders placed.  [name] was informed that the remainder of the evaluation will be completed by another provider, this initial triage assessment does not replace that evaluation, and the importance of remaining in the ED until their evaluation is complete.     Azucena Cecil, PA-C 05/30/22 2052

## 2022-05-30 NOTE — ED Provider Notes (Signed)
[facility] DEPT [facility] Provider Note: Georgena Spurling, MD, FACEP  CSN: [id number] MRN: [id number] ARRIVAL: 05/30/22 at [place]: [place]  Vaginal Bleeding   HISTORY OF PRESENT ILLNESS  05/30/22 11:01 PM Melissa Donovan is a 46 y.o. female who has had severe vaginal bleeding since March 23 23.  Her bleeding worsened over the past 2 days and she has saturated a large pad within 1 hour.  She has passed large clots and is having lower abdominal pain which she rates as an 8 out of 10.  She is currently on norethindrone 5 mg 3 times daily which has not stopped her bleeding.  She has associated weakness, shortness of breath, and chest pain.  She gets chest pain frequently (she attributes this to chronic Lyme disease) and this current episode of chest pain has been present for about 5 days.  It is located across her upper chest that is dull in nature.  It is worse with deep breathing and sometimes with movement there is a sharp component.  She has taken Mobic, Tylenol and oxycodone without relief of the chest pain.   Past Medical History:  Diagnosis Date   Arthritis    Asthma    Attention deficit hyperactivity disorder    Fibromyalgia    Lyme disease     Past Surgical History:  Procedure Laterality Date   BACK SURGERY     CHOLECYSTECTOMY      Family History  Problem Relation Age of Onset   Hypertension Mother    Diabetes Mother    Hypertension Father     Social History   Tobacco Use   Smoking status: Never   Smokeless tobacco: Never  Substance Use Topics   Alcohol use: Yes   Drug use: Never    Prior to Admission medications   Medication Sig Start Date End Date Taking? Authorizing Provider  acetaZOLAMIDE (DIAMOX) 500 MG capsule Take 500 mg by mouth once.    [provider]  albuterol (VENTOLIN HFA) 108 (90 Base) MCG/ACT inhaler Inhale 1-2 puffs into the lungs every 6 (six) hours as needed for wheezing or shortness of breath. 08/22/21    Wieters, Hallie C, PA-C  ALPRAZolam (XANAX) 0.25 MG tablet Take 0.25 mg by mouth at bedtime as needed for anxiety.    [provider]  amLODipine-benazepril (LOTREL) 10-20 MG capsule Take 1 capsule by mouth daily.    [provider]  atorvastatin (LIPITOR) 10 MG tablet Take 10 mg by mouth daily.    [provider]  atovaquone (MEPRON) 750 MG/5ML suspension Take by mouth daily.    [provider]  azithromycin (ZITHROMAX) 250 MG tablet Take first 2 tablets together, then 1 every day until finished. 08/22/21   Wieters, Hallie C, PA-C  Bromfenac Sodium (PROLENSA) 0.07 % SOLN Place 1 drop into both eyes 4 (four) times daily. 02/09/21   Bernarda Caffey, MD  budesonide (PULMICORT) 0.5 MG/2ML nebulizer solution Take 0.5 mg by nebulization 2 (two) times daily.    [provider]  buPROPion (ZYBAN) 150 MG 12 hr tablet Take 150 mg by mouth 2 (two) times daily.    [provider]  Ciprofloxacin-Ciproflox HCl 500 MG tablet Take 500 mg by mouth daily.    [provider]  clindamycin (CLEOCIN) 150 MG capsule Take by mouth 3 (three) times daily.    [provider]  cyclobenzaprine (FLEXERIL) 10 MG tablet Take 10 mg by mouth 3 (three) times daily as needed. pain  [provider]  dextroamphetamine (DEXTROSTAT) 10 MG tablet Take 10 mg by mouth daily.    [provider]  diazepam (VALIUM) 2 MG tablet Take 2 mg by mouth every 6 (six) hours as needed for anxiety.    [provider]  diclofenac (VOLTAREN) 75 MG EC tablet Take 75 mg by mouth 2 (two) times daily.    [provider]  DULoxetine (CYMBALTA) 30 MG capsule Take 30 mg by mouth daily.    [provider]  ergocalciferol (VITAMIN D2) 1.25 MG (50000 UT) capsule Take 50,000 Units by mouth once a week.    [provider]  etodolac (LODINE) 400 MG tablet Take 400 mg by mouth 2 (two) times daily.    [provider]  Fenoprofen Calcium  (NALFON) 200 MG CAPS capsule Take 200 mg by mouth in the morning and at bedtime.    [provider]  fluticasone (FLOVENT HFA) 44 MCG/ACT inhaler Inhale 2 puffs into the lungs 2 (two) times daily. 05/10/20   Tacy Learn, PA-C  gabapentin (NEURONTIN) 800 MG tablet Take 800 mg by mouth 3 (three) times daily.    [provider]  Glycopyrrolate-Formoterol (BEVESPI AEROSPHERE) 9-4.8 MCG/ACT AERO Inhale into the lungs.    [provider]  guaiFENesin-codeine 100-10 MG/5ML syrup Take 5-10 mLs by mouth at bedtime as needed for cough. 08/22/21   Wieters, Hallie C, PA-C  ipratropium-albuterol (DUONEB) 0.5-2.5 (3) MG/3ML SOLN Take 3 mLs by nebulization every 6 (six) hours as needed. 08/22/21   Wieters, Hallie C, PA-C  ketorolac (TORADOL) 10 MG tablet Take 10 mg by mouth every 6 (six) hours as needed.    [provider]  Levonorgestrel-Ethinyl Estradiol (DAYSEE) 0.15-0.03 &0.01 MG tablet Take 1 tablet by mouth daily.    [provider]  LORazepam (ATIVAN) 0.5 MG tablet Take 0.5 mg by mouth every 8 (eight) hours.    [provider]  losartan (COZAAR) 25 MG tablet Take 25 mg by mouth daily.    [provider]  meloxicam (MOBIC) 15 MG tablet Take 15 mg by mouth daily.    [provider]  memantine (NAMENDA) 10 MG tablet Take 10 mg by mouth 2 (two) times daily.    [provider]  metaxalone (SKELAXIN) 800 MG tablet Take 800 mg by mouth 3 (three) times daily.    [provider]  methocarbamol (ROBAXIN) 500 MG tablet Take 500 mg by mouth 4 (four) times daily.    [provider]  naproxen (NAPROSYN) 500 MG tablet Take 1,000 mg by mouth 2 (two) times daily as needed. pain    [provider]  norethindrone (CAMILA) 0.35 MG tablet Take 1 tablet by mouth daily.    [provider]  prednisoLONE acetate (PRED FORTE) 1 % ophthalmic suspension Place 1 drop into both eyes 4 (four) times daily. 03/09/21   Bernarda Caffey, MD    Allergies Patient has no known allergies.   REVIEW OF SYSTEMS  Negative except as noted here or in the History of Present Illness.   PHYSICAL EXAMINATION  Initial Vital Signs Blood pressure (!) 129/93, pulse (!) 102, temperature 99.6 F (37.6 C), temperature source Oral, resp. rate 18, weight 102.1 kg, last menstrual period 05/16/2022, SpO2 100 %.  Examination General: Well-developed, well-nourished female in no acute distress; appearance consistent with age of record HENT: normocephalic; atraumatic Eyes: pupils equal, round and reactive to light; extraocular muscles intact Neck: supple Heart: regular rate and rhythm; Lungs: clear to  auscultation bilaterally Abdomen: soft; nondistended; suprapubic tenderness; bowel sounds present Extremities: No deformity; full range of motion; pulses normal Neurologic: Awake, alert and oriented; motor function intact in all extremities and symmetric; no facial droop Skin: Warm and dry Psychiatric: Normal mood and affect   RESULTS  Summary of this visit's results, reviewed and interpreted by myself:   EKG Interpretation  Date/Time:  Tuesday May 30 2022 19:49:37 EDT Ventricular Rate:  96 PR Interval:  146 QRS Duration: 68 QT Interval:  334 QTC Calculation: 421 R Axis:   58 Text Interpretation: Normal sinus rhythm Normal ECG When compared with ECG of 27-Jan-2021 19:08, PREVIOUS ECG IS PRESENT Confirmed by Nanda Quinton [phone]) on 05/30/2022 10:15:31 PM       Laboratory Studies: Results for orders placed or performed during the hospital encounter of 05/30/22 (from the past 24 hour(s))  Pregnancy, urine     Status: None   Collection Time: 05/30/22  8:07 PM  Result Value Ref Range   Preg Test, Ur NEGATIVE NEGATIVE  CBC with Differential     Status: Abnormal   Collection Time: 05/30/22  8:07 PM  Result Value Ref Range   WBC 7.4 4.0 - 10.5 K/uL   RBC 3.96 3.87 - 5.11 MIL/uL   Hemoglobin 10.2 (L) 12.0 - 15.0 g/dL   HCT  31.8 (L) 36.0 - 46.0 %   MCV 80.3 80.0 - 100.0 fL   MCH 25.8 (L) 26.0 - 34.0 pg   MCHC 32.1 30.0 - 36.0 g/dL   RDW 14.6 11.5 - 15.5 %   Platelets 308 150 - 400 K/uL   nRBC 0.0 0.0 - 0.2 %   Neutrophils Relative % 60 %   Neutro Abs 4.4 1.7 - 7.7 K/uL   Lymphocytes Relative 32 %   Lymphs Abs 2.4 0.7 - 4.0 K/uL   Monocytes Relative 6 %   Monocytes Absolute 0.5 0.1 - 1.0 K/uL   Eosinophils Relative 1 %   Eosinophils Absolute 0.1 0.0 - 0.5 K/uL   Basophils Relative 1 %   Basophils Absolute 0.0 0.0 - 0.1 K/uL   Immature Granulocytes 0 %   Abs Immature Granulocytes 0.02 0.00 - 0.07 K/uL  Basic metabolic panel     Status: None   Collection Time: 05/30/22  8:07 PM  Result Value Ref Range   Sodium 140 135 - 145 mmol/L   Potassium 3.9 3.5 - 5.1 mmol/L   Chloride 110 98 - 111 mmol/L   CO2 24 22 - 32 mmol/L   Glucose, Bld 92 70 - 99 mg/dL   BUN 12 6 - 20 mg/dL   Creatinine, Ser 0.93 0.44 - 1.00 mg/dL   Calcium 9.6 8.9 - 10.3 mg/dL   GFR, Estimated >60 >60 mL/min   Anion gap 6 5 - 15  Urinalysis, Routine w reflex microscopic Urine, Clean Catch     Status: None   Collection Time: 05/30/22  8:07 PM  Result Value Ref Range   Color, Urine YELLOW YELLOW   APPearance CLEAR CLEAR   Specific Gravity, Urine 1.010 1.005 - 1.030   pH 6.5 5.0 - 8.0   Glucose, UA NEGATIVE NEGATIVE mg/dL   Hgb urine dipstick NEGATIVE NEGATIVE   Bilirubin Urine NEGATIVE NEGATIVE   Ketones, ur NEGATIVE NEGATIVE mg/dL   Protein, ur NEGATIVE NEGATIVE mg/dL   Nitrite NEGATIVE NEGATIVE   Leukocytes,Ua NEGATIVE NEGATIVE  Troponin I (High Sensitivity)     Status: None   Collection Time: 05/30/22  8:50 PM  Result Value Ref  Range   Troponin I (High Sensitivity) 2 <18 ng/L  Troponin I (High Sensitivity)     Status: None   Collection Time: 05/30/22 11:35 PM  Result Value Ref Range   Troponin I (High Sensitivity) <2 <18 ng/L   Imaging Studies: DG Chest Port 1 View  Result Date: 05/30/2022 CLINICAL DATA:  Chest pain  and vaginal bleeding. EXAM: PORTABLE CHEST 1 VIEW COMPARISON:  August 22, 2021 FINDINGS: The heart size and mediastinal contours are within normal limits. Both lungs are clear. The visualized skeletal structures are unremarkable. IMPRESSION: No active disease. Electronically Signed   By: Virgina Norfolk M.D.   On: 05/30/2022 21:32   US PELVIC COMPLETE W TRANSVAGINAL AND TORSION R/O  Result Date: 05/31/2022 CLINICAL DATA:  Pelvic pain and vaginal bleeding. EXAM: TRANSABDOMINAL AND TRANSVAGINAL ULTRASOUND OF PELVIS TECHNIQUE: Both transabdominal and transvaginal ultrasound examinations of the pelvis were performed. Transabdominal technique was performed for global imaging of the pelvis including uterus, ovaries, adnexal regions, and pelvic cul-de-sac. It was necessary to proceed with endovaginal exam following the transabdominal exam to visualize the uterus and endometrium. COMPARISON:  March 28, 2022 FINDINGS: Uterus Measurements: 11.9 cm x 9.7 cm x 10.3 cm = volume: 624.2 mL. Multiple heterogeneous hypoechoic uterine masses are seen. The largest measures approximately 6.0 cm x 5.9 cm x 5.2 cm and is located within the uterine fundus. This is seen on the prior study. Endometrium Thickness: N/A.  Poorly visualized. Right ovary The right ovary is not visualized. Left ovary The left ovary is not visualized. Other findings No abnormal free fluid. IMPRESSION: Multiple heterogeneous uterine fibroids. Electronically Signed   By: Virgina Norfolk M.D.   On: 05/31/2022 01:42    ED COURSE and MDM  Nursing notes, initial and subsequent vitals signs, including pulse oximetry, reviewed and interpreted by myself.  Vitals:   05/30/22 2315 05/30/22 2330 05/31/22 0030 05/31/22 0130  BP: (!) 142/91 138/82 (!) 141/85 123/73  Pulse: 90 74 88 78  Resp: '16 16 16 16  '$ Temp:      TempSrc:      SpO2: 100% 100% 100% 99%  Weight:       Medications  sodium chloride 0.9 % bolus 1,000 mL (1,000 mLs Intravenous New Bag/Given  05/30/22 2329)  ketorolac (TORADOL) 15 MG/ML injection 15 mg (15 mg Intravenous Given 05/30/22 2330)   11:16 PM Patient is borderline tachycardic.  We will give her a fluid bolus.  I suspect her true hemoglobin is somewhat lower than shown in the CBC due to hemoconcentration.  She is already on norethindrone and I am not sure that additional hormone therapy is indicated here in the ED but she will need close follow-up with her OB/GYN as this is an ongoing problem that is acutely worsened despite treatment.  2:01 AM No significant changes seen on pelvic ultrasound.  Patient has known fibroids, likely the cause of her persistent bleeding.  She is scheduled for hysterectomy in August but is also scheduled for an MRI procedure at [facility] 06/09/2022 where she will be evaluated for a possible embolization procedure.    PROCEDURES  Procedures   ED DIAGNOSES     ICD-10-CM   1. Uterine leiomyoma, unspecified location  D25.9     2. Abnormal vaginal bleeding  N93.9     3. Anemia due to acute blood loss  D62          Angelette Ganus, Jenny Reichmann, MD 05/31/22 [phone]

## 2022-05-31 ENCOUNTER — Emergency Department (HOSPITAL_BASED_OUTPATIENT_CLINIC_OR_DEPARTMENT_OTHER): Payer: Medicare Other

## 2022-05-31 DIAGNOSIS — N939 Abnormal uterine and vaginal bleeding, unspecified: Secondary | ICD-10-CM | POA: Diagnosis not present

## 2022-05-31 LAB — URINALYSIS, ROUTINE W REFLEX MICROSCOPIC
Bilirubin Urine: NEGATIVE
Glucose, UA: NEGATIVE mg/dL
Hgb urine dipstick: NEGATIVE
Ketones, ur: NEGATIVE mg/dL
Leukocytes,Ua: NEGATIVE
Nitrite: NEGATIVE
Protein, ur: NEGATIVE mg/dL
Specific Gravity, Urine: 1.01 (ref 1.005–1.030)
pH: 6.5 (ref 5.0–8.0)

## 2022-05-31 LAB — TROPONIN I (HIGH SENSITIVITY): Troponin I (High Sensitivity): <2 ng/L (ref <18)

## 2022-12-01 ENCOUNTER — Emergency Department (HOSPITAL_COMMUNITY): Payer: Medicare Other

## 2022-12-01 ENCOUNTER — Other Ambulatory Visit: Payer: Self-pay

## 2022-12-01 ENCOUNTER — Emergency Department (HOSPITAL_COMMUNITY)
Admission: EM | Admit: 2022-12-01 | Discharge: 2022-12-01 | Disposition: A | Discharge status: Home / Self Care | Payer: Medicare Other | Attending: Emergency Medicine | Admitting: Emergency Medicine

## 2022-12-01 ENCOUNTER — Encounter (HOSPITAL_COMMUNITY): Payer: Self-pay

## 2022-12-01 DIAGNOSIS — R079 Chest pain, unspecified: Secondary | ICD-10-CM | POA: Insufficient documentation

## 2022-12-01 DIAGNOSIS — M545 Low back pain, unspecified: Secondary | ICD-10-CM | POA: Insufficient documentation

## 2022-12-01 DIAGNOSIS — S39012A Strain of muscle, fascia and tendon of lower back, initial encounter: Secondary | ICD-10-CM

## 2022-12-01 LAB — COMPREHENSIVE METABOLIC PANEL
ALT: 18 U/L (ref 0–44)
AST: 14 U/L — ABNORMAL LOW (ref 15–41)
Albumin: 3.9 g/dL (ref 3.5–5.0)
Alkaline Phosphatase: 55 U/L (ref 38–126)
Anion gap: 6 (ref 5–15)
BUN: 19 mg/dL (ref 6–20)
CO2: 28 mmol/L (ref 22–32)
Calcium: 8.9 mg/dL (ref 8.9–10.3)
Chloride: 106 mmol/L (ref 98–111)
Creatinine, Ser: 0.91 mg/dL (ref 0.44–1.00)
GFR, Estimated: >60 mL/min (ref >60)
Glucose, Bld: 88 mg/dL (ref 70–99)
Potassium: 3.4 mmol/L — ABNORMAL LOW (ref 3.5–5.1)
Sodium: 140 mmol/L (ref 135–145)
Total Bilirubin: 0.5 mg/dL (ref 0.3–1.2)
Total Protein: 7.5 g/dL (ref 6.5–8.1)

## 2022-12-01 LAB — CBC
HCT: 36.1 % (ref 36.0–46.0)
Hemoglobin: 11.5 g/dL — ABNORMAL LOW (ref 12.0–15.0)
MCH: 27.7 pg (ref 26.0–34.0)
MCHC: 31.9 g/dL (ref 30.0–36.0)
MCV: 87 fL (ref 80.0–100.0)
Platelets: 274 10*3/uL (ref 150–400)
RBC: 4.15 MIL/uL (ref 3.87–5.11)
RDW: 13.1 % (ref 11.5–15.5)
WBC: 6.1 10*3/uL (ref 4.0–10.5)
nRBC: 0 % (ref 0.0–0.2)

## 2022-12-01 LAB — TROPONIN I (HIGH SENSITIVITY): Troponin I (High Sensitivity): <2 ng/L (ref <18)

## 2022-12-01 LAB — I-STAT BETA HCG BLOOD, ED (MC, WL, AP ONLY): I-stat hCG, quantitative: <5 m[IU]/mL (ref <5)

## 2022-12-01 MED ORDER — MORPHINE SULFATE (PF) 4 MG/ML IV SOLN
6.0000 mg | Freq: Once | INTRAVENOUS | Status: AC
  Administered 2022-12-01: 6 mg via INTRAMUSCULAR
  Filled 2022-12-01: qty 2

## 2022-12-01 MED ORDER — OXYCODONE-ACETAMINOPHEN 5-325 MG PO TABS
1.0000 | ORAL_TABLET | Freq: Once | ORAL | Status: AC
  Administered 2022-12-01: 1 via ORAL
  Filled 2022-12-01: qty 1

## 2022-12-01 MED ORDER — DIAZEPAM 5 MG PO TABS
10.0000 mg | ORAL_TABLET | Freq: Once | ORAL | Status: AC
  Administered 2022-12-01: 10 mg via ORAL
  Filled 2022-12-01: qty 2

## 2022-12-01 MED ORDER — KETOROLAC TROMETHAMINE 30 MG/ML IJ SOLN
30.0000 mg | Freq: Once | INTRAMUSCULAR | Status: AC
  Administered 2022-12-01: 30 mg via INTRAMUSCULAR
  Filled 2022-12-01: qty 1

## 2022-12-01 NOTE — ED Provider Notes (Addendum)
[facility] DEPT Provider Note   CSN: [id number] Arrival date & time: 12/01/22  1225     History  Chief Complaint  Patient presents with   Back Pain   Chest Pain    Melissa Donovan is a 46 y.o. female.  46 year old female presents for right-sided flank pain which began after she moved over while sitting on the couch.  Pain characterized as sharp and worse with certain positions.  Radiates across to the left side of her back.  No bowel or bladder dysfunction.  Prior history of lumbar spinal surgery.  Patient also has secondary complaint of intermittent chest pain which is going on for several days which last for a few seconds.  No exertional component to it.  Does not sound anginal.  Patient has used her home medications without relief.       Home Medications Prior to Admission medications   Medication Sig Start Date End Date Taking? Authorizing Provider  acetaZOLAMIDE (DIAMOX) 500 MG capsule Take 500 mg by mouth once.    [provider]  albuterol (VENTOLIN HFA) 108 (90 Base) MCG/ACT inhaler Inhale 1-2 puffs into the lungs every 6 (six) hours as needed for wheezing or shortness of breath. 08/22/21   Wieters, Hallie C, PA-C  ALPRAZolam (XANAX) 0.25 MG tablet Take 0.25 mg by mouth at bedtime as needed for anxiety.    [provider]  amLODipine-benazepril (LOTREL) 10-20 MG capsule Take 1 capsule by mouth daily.    [provider]  atorvastatin (LIPITOR) 10 MG tablet Take 10 mg by mouth daily.    [provider]  atovaquone (MEPRON) 750 MG/5ML suspension Take by mouth daily.    [provider]  azithromycin (ZITHROMAX) 250 MG tablet Take first 2 tablets together, then 1 every day until finished. 08/22/21   Wieters, Hallie C, PA-C  Bromfenac Sodium (PROLENSA) 0.07 % SOLN Place 1 drop into both eyes 4 (four) times daily. 02/09/21   Bernarda Caffey, MD  budesonide (PULMICORT) 0.5 MG/2ML nebulizer solution Take 0.5  mg by nebulization 2 (two) times daily.    [provider]  buPROPion (ZYBAN) 150 MG 12 hr tablet Take 150 mg by mouth 2 (two) times daily.    [provider]  Ciprofloxacin-Ciproflox HCl 500 MG tablet Take 500 mg by mouth daily.    [provider]  clindamycin (CLEOCIN) 150 MG capsule Take by mouth 3 (three) times daily.    [provider]  cyclobenzaprine (FLEXERIL) 10 MG tablet Take 10 mg by mouth 3 (three) times daily as needed. pain    [provider]  dextroamphetamine (DEXTROSTAT) 10 MG tablet Take 10 mg by mouth daily.    [provider]  diazepam (VALIUM) 2 MG tablet Take 2 mg by mouth every 6 (six) hours as needed for anxiety.    [provider]  diclofenac (VOLTAREN) 75 MG EC tablet Take 75 mg by mouth 2 (two) times daily.    [provider]  DULoxetine (CYMBALTA) 30 MG capsule Take 30 mg by mouth daily.    [provider]  ergocalciferol (VITAMIN D2) 1.25 MG (50000 UT) capsule Take 50,000 Units by mouth once a week.    [provider]  etodolac (LODINE) 400 MG tablet Take 400 mg by mouth 2 (two) times daily.    [provider]  Fenoprofen Calcium (NALFON) 200 MG CAPS capsule Take 200 mg by mouth in the morning and at bedtime.    [provider]  fluticasone (FLOVENT HFA) 44 MCG/ACT inhaler Inhale 2 puffs into the lungs 2 (two) times daily. 05/10/20   Tacy Learn, PA-C  gabapentin (NEURONTIN) 800 MG tablet Take 800 mg by mouth 3 (three) times daily.    [provider]  Glycopyrrolate-Formoterol (BEVESPI AEROSPHERE) 9-4.8 MCG/ACT AERO Inhale into the lungs.    [provider]  guaiFENesin-codeine 100-10 MG/5ML syrup Take 5-10 mLs by mouth at bedtime as needed for cough. 08/22/21   Wieters, Hallie C, PA-C  ipratropium-albuterol (DUONEB) 0.5-2.5 (3) MG/3ML SOLN Take 3 mLs by nebulization every 6 (six) hours as needed. 08/22/21   Wieters, Hallie C, PA-C  ketorolac  (TORADOL) 10 MG tablet Take 10 mg by mouth every 6 (six) hours as needed.    [provider]  Levonorgestrel-Ethinyl Estradiol (DAYSEE) 0.15-0.03 &0.01 MG tablet Take 1 tablet by mouth daily.    [provider]  LORazepam (ATIVAN) 0.5 MG tablet Take 0.5 mg by mouth every 8 (eight) hours.    [provider]  losartan (COZAAR) 25 MG tablet Take 25 mg by mouth daily.    [provider]  meloxicam (MOBIC) 15 MG tablet Take 15 mg by mouth daily.    [provider]  memantine (NAMENDA) 10 MG tablet Take 10 mg by mouth 2 (two) times daily.    [provider]  metaxalone (SKELAXIN) 800 MG tablet Take 800 mg by mouth 3 (three) times daily.    [provider]  methocarbamol (ROBAXIN) 500 MG tablet Take 500 mg by mouth 4 (four) times daily.    [provider]  naproxen (NAPROSYN) 500 MG tablet Take 1,000 mg by mouth 2 (two) times daily as needed. pain    [provider]  norethindrone (CAMILA) 0.35 MG tablet Take 1 tablet by mouth daily.    [provider]  prednisoLONE acetate (PRED FORTE) 1 % ophthalmic suspension Place 1 drop into both eyes 4 (four) times daily. 03/09/21   Bernarda Caffey, MD      Allergies    Patient has no known allergies.    Review of Systems   Review of Systems  All other systems reviewed and are negative.   Physical Exam Updated Vital Signs BP (!) 126/92 (BP Location: Left Arm)   Pulse 98   Temp 98 F (36.7 C) (Oral)   Resp 16   Ht 1.715 m (5' 7.5")   Wt 98 kg   LMP 05/16/2022 (Exact Date)   SpO2 100%   BMI 33.33 kg/m  Physical Exam Vitals and nursing note reviewed.  Constitutional:      General: She is not in acute distress.    Appearance: Normal appearance. She is well-developed. She is not toxic-appearing.  HENT:     Head: Normocephalic and atraumatic.  Eyes:     General: Lids are normal.     Conjunctiva/sclera: Conjunctivae normal.     Pupils: Pupils are equal, round,  and reactive to light.  Neck:     Thyroid: No thyroid mass.     Trachea: No tracheal deviation.  Cardiovascular:     Rate and Rhythm: Normal rate and regular rhythm.     Heart sounds: Normal heart sounds. No murmur heard.    No gallop.  Pulmonary:     Effort: Pulmonary effort is normal. No respiratory distress.     Breath sounds: Normal breath sounds. No stridor. No decreased breath sounds, wheezing, rhonchi or rales.  Abdominal:     General: There is no distension.  Palpations: Abdomen is soft.     Tenderness: There is no abdominal tenderness. There is no rebound.  Musculoskeletal:        General: No tenderness. Normal range of motion.     Cervical back: Normal range of motion and neck supple.       Back:  Skin:    General: Skin is warm and dry.     Findings: No abrasion or rash.  Neurological:     Mental Status: She is alert and oriented to person, place, and time. Mental status is at baseline.     GCS: GCS eye subscore is 4. GCS verbal subscore is 5. GCS motor subscore is 6.     Cranial Nerves: No cranial nerve deficit.     Sensory: No sensory deficit.     Motor: Motor function is intact.     Comments: Strength is 5 of 5 in upper as well as lower extremities  Psychiatric:        Attention and Perception: Attention normal.        Speech: Speech normal.        Behavior: Behavior normal.     ED Results / Procedures / Treatments   Labs (all labs ordered are listed, but only abnormal results are displayed) Labs Reviewed  CBC - Abnormal; Notable for the following components:      Result Value   Hemoglobin 11.5 (*)    All other components within normal limits  COMPREHENSIVE METABOLIC PANEL  I-STAT BETA HCG BLOOD, ED ([facility], [facility], [facility] ONLY)  TROPONIN I (HIGH SENSITIVITY)    EKG EKG Interpretation  Date/Time:  Friday December 01 2022 12:34:45 EST Ventricular Rate:  96 PR Interval:  154 QRS Duration: 81 QT Interval:  353 QTC Calculation: 447 R Axis:   40 Text  Interpretation: Sinus rhythm Borderline T abnormalities, diffuse leads No significant change since last tracing Confirmed by Lacretia Leigh ([id number]) on 12/01/2022 1:52:50 PM  Radiology DG Chest 2 View  Result Date: 12/01/2022 CLINICAL DATA:  Centralized chest pain and tightness for 4 days. EXAM: CHEST - 2 VIEW COMPARISON:  Radiographs 05/30/2022 and 08/22/2021.  CT 01/28/2021. FINDINGS: The heart size and mediastinal contours are normal. The lungs are clear. There is no pleural effusion or pneumothorax. No acute osseous findings are identified. IMPRESSION: Stable chest.  No evidence of active cardiopulmonary process. Electronically Signed   By: Richardean Sale M.D.   On: 12/01/2022 13:19    Procedures Procedures    Medications Ordered in ED Medications  diazepam (VALIUM) tablet 10 mg (has no administration in time range)  morphine (PF) 4 MG/ML injection 6 mg (has no administration in time range)  ketorolac (TORADOL) 30 MG/ML injection 30 mg (has no administration in time range)  oxyCODONE-acetaminophen (PERCOCET/ROXICET) 5-325 MG per tablet 1 tablet (1 tablet Oral Given 12/01/22 1320)    ED Course/ Medical Decision Making/ A&P                           Medical Decision Making Risk Prescription drug management.   Patient medicated here and feels better.  No evidence of cauda equina.  No neurological deficits.  No concern for ACS.  Troponin negative.  EKG interpretation shows no signs of acute coronary ischemia.  Will be discharged to follow-up with her spine surgeon today        Final Clinical Impression(s) / ED Diagnoses Final diagnoses:  None    Rx / DC Orders ED  Discharge Orders     None         Lacretia Leigh, MD 12/01/22 1521    Lacretia Leigh, MD 12/01/22 [phone]

## 2022-12-01 NOTE — ED Provider Triage Note (Cosign Needed Addendum)
Emergency Medicine Provider Triage Evaluation Note  Melissa Donovan , a 46 y.o. female  was evaluated in triage.  Pt complains of chest pain and right-sided back pain.  Patient reports chest pain that has been present since 4 to 5 days ago.  Reports constant nature of pain experienced as "chest wall pain."  Feels no associated shortness of breath.  Notes some association with laying flat.  Denies any recent illness, fever, cough, congestion.  Patient had right-sided low back pain with radiation down posterior right leg occurring as of yesterday.  Patient has history of similar symptoms in the past of which she had lumbar surgery performed back in 2016.  She reports some associated numbness in the sole of right foot as well as right leg weakness.  Denies saddle anesthesia, bowel/bladder dysfunction, fever, history of IV drug use, no malignancy.  Was sent by primary care due to chest pain. Neuro surgery appointment today around 3:45  Review of Systems  Positive: See above Negative:   Physical Exam  BP (!) 126/92 (BP Location: Left Arm)   Pulse 98   Temp 98 F (36.7 C) (Oral)   Resp 16   Ht 5' 7.5" (1.715 m)   Wt 98 kg   LMP 05/16/2022 (Exact Date)   SpO2 100%   BMI 33.33 kg/m  Gen:   Awake, no distress   Resp:  Normal effort  MSK:   Moves extremities without difficulty  Other:  Tender to palpation right paraspinal lumbar region.  Medical Decision Making  Medically screening exam initiated at 12:50 PM.  Appropriate orders placed.  Melissa Donovan was informed that the remainder of the evaluation will be completed by another provider, this initial triage assessment does not replace that evaluation, and the importance of remaining in the ED until their evaluation is complete.     Wilnette Kales, [place] 12/01/22 1252    Wilnette Kales, PA 12/01/22 1253    Wilnette Kales, [place] 12/01/22 1315

## 2022-12-01 NOTE — ED Triage Notes (Signed)
Patient went to her PCP today with c/o right lower back pain since yesterday. Patient states the pain radiates into her right leg to the right foot. Patient told her PCP that she has had intermittent chest pain x 4 days. An EKG was completed. Patient was then told to come to the ED due to an abnormal EKG. Patient denies SOB or N/V.

## 2024-10-10 ENCOUNTER — Emergency Department (HOSPITAL_BASED_OUTPATIENT_CLINIC_OR_DEPARTMENT_OTHER)
Admission: EM | Admit: 2024-10-10 | Discharge: 2024-10-10 | Disposition: A | Discharge status: Home / Self Care | Attending: Emergency Medicine | Admitting: Emergency Medicine

## 2024-10-10 ENCOUNTER — Encounter (HOSPITAL_BASED_OUTPATIENT_CLINIC_OR_DEPARTMENT_OTHER): Payer: Self-pay

## 2024-10-10 ENCOUNTER — Other Ambulatory Visit: Payer: Self-pay

## 2024-10-10 ENCOUNTER — Emergency Department (HOSPITAL_BASED_OUTPATIENT_CLINIC_OR_DEPARTMENT_OTHER)

## 2024-10-10 DIAGNOSIS — R519 Headache, unspecified: Secondary | ICD-10-CM | POA: Diagnosis not present

## 2024-10-10 DIAGNOSIS — I10 Essential (primary) hypertension: Secondary | ICD-10-CM | POA: Insufficient documentation

## 2024-10-10 DIAGNOSIS — M7918 Myalgia, other site: Secondary | ICD-10-CM

## 2024-10-10 DIAGNOSIS — R0789 Other chest pain: Secondary | ICD-10-CM | POA: Diagnosis present

## 2024-10-10 DIAGNOSIS — M25512 Pain in left shoulder: Secondary | ICD-10-CM | POA: Insufficient documentation

## 2024-10-10 DIAGNOSIS — Y92410 Unspecified street and highway as the place of occurrence of the external cause: Secondary | ICD-10-CM | POA: Insufficient documentation

## 2024-10-10 DIAGNOSIS — R0602 Shortness of breath: Secondary | ICD-10-CM | POA: Insufficient documentation

## 2024-10-10 DIAGNOSIS — M546 Pain in thoracic spine: Secondary | ICD-10-CM | POA: Diagnosis not present

## 2024-10-10 DIAGNOSIS — Z79899 Other long term (current) drug therapy: Secondary | ICD-10-CM | POA: Diagnosis not present

## 2024-10-10 MED ORDER — METOCLOPRAMIDE HCL 10 MG PO TABS
5.0000 mg | ORAL_TABLET | Freq: Once | ORAL | Status: AC
  Administered 2024-10-10: 5 mg via ORAL
  Filled 2024-10-10: qty 1

## 2024-10-10 MED ORDER — KETOROLAC TROMETHAMINE 15 MG/ML IJ SOLN
15.0000 mg | Freq: Once | INTRAMUSCULAR | Status: AC
  Administered 2024-10-10: 15 mg via INTRAMUSCULAR
  Filled 2024-10-10: qty 1

## 2024-10-10 MED ORDER — DIPHENHYDRAMINE HCL 25 MG PO CAPS
25.0000 mg | ORAL_CAPSULE | Freq: Once | ORAL | Status: AC
  Administered 2024-10-10: 25 mg via ORAL
  Filled 2024-10-10: qty 1

## 2024-10-10 MED ORDER — METHOCARBAMOL 500 MG PO TABS: 500.0000 mg | ORAL_TABLET | Freq: Two times a day (BID) | ORAL | 0 refills | Status: DC | PRN

## 2024-10-10 MED ORDER — CYCLOBENZAPRINE HCL 10 MG PO TABS: 10.0000 mg | ORAL_TABLET | Freq: Two times a day (BID) | ORAL | 0 refills | Status: AC | PRN

## 2024-10-10 MED ORDER — METHOCARBAMOL 500 MG PO TABS
500.0000 mg | ORAL_TABLET | Freq: Once | ORAL | Status: AC
  Administered 2024-10-10: 500 mg via ORAL
  Filled 2024-10-10: qty 1
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
